# Patient Record
Sex: Female | Born: 1985 | Race: Black or African American | Hispanic: No | Marital: Married | Smoking: Never smoker
Health system: Southern US, Community
[De-identification: ages and names within clinical notes are randomized; demographics above are authoritative.]

## PROBLEM LIST (undated history)

## (undated) DIAGNOSIS — G43909 Migraine, unspecified, not intractable, without status migrainosus: Secondary | ICD-10-CM

## (undated) DIAGNOSIS — E0590 Thyrotoxicosis, unspecified without thyrotoxic crisis or storm: Secondary | ICD-10-CM

## (undated) DIAGNOSIS — K30 Functional dyspepsia: Secondary | ICD-10-CM

## (undated) DIAGNOSIS — E049 Nontoxic goiter, unspecified: Secondary | ICD-10-CM

## (undated) DIAGNOSIS — R569 Unspecified convulsions: Secondary | ICD-10-CM

## (undated) DIAGNOSIS — R002 Palpitations: Secondary | ICD-10-CM

---

## 2008-05-12 IMAGING — CT CT HEAD W/O CM
1 series · 16 of 30 positions shown, 20 images · IV contrast (agent unspecified)
Comparison: None.

CLINICAL DATA: Migraine headaches and anxiety attack.
 HEAD CT WITHOUT CONTRAST:
TECHNIQUE: Contiguous axial images were obtained from the base of the skull through the vertex according to standard protocol without contrast.

[Series 2: head routine 4.8 h47s · axial · 0.44mm/px · z∈[-113,+39]mm · 16 of 36 slices shown, 20 images]
[im 2/36  brain]
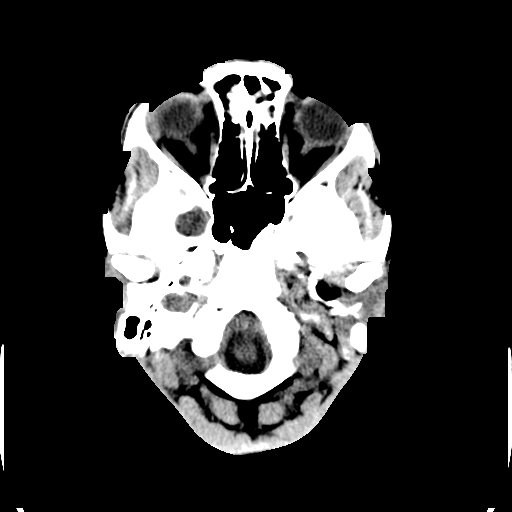
[im 2/36  bone]
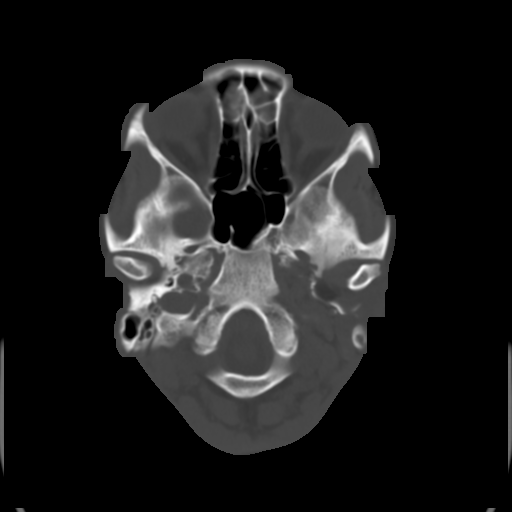
[im 4/36  brain]
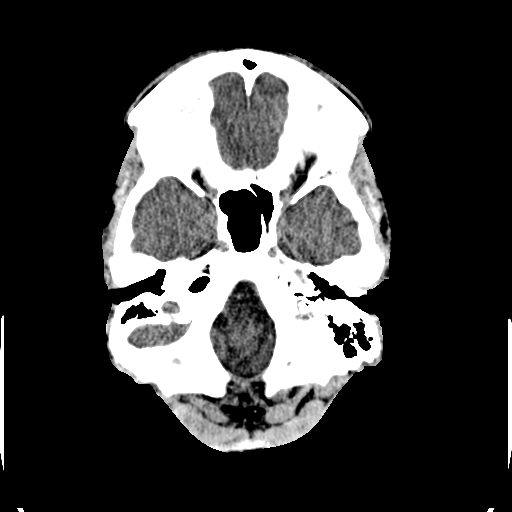
[im 7/36  brain]
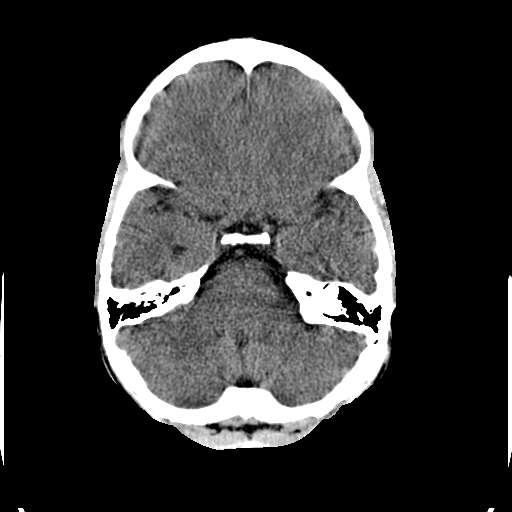
[im 9/36  brain]
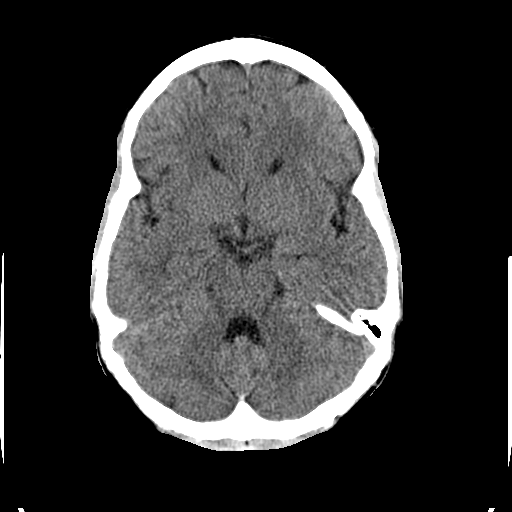
[im 10/36  brain]
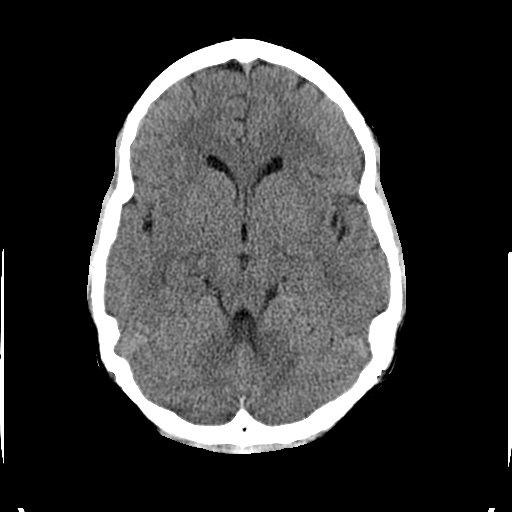
[im 10/36  bone]
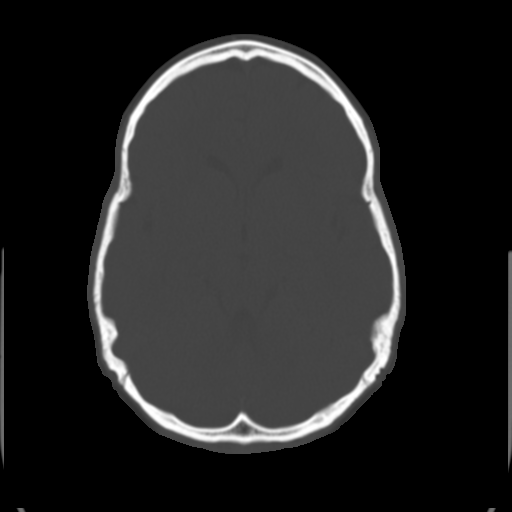
[im 13/36  brain]
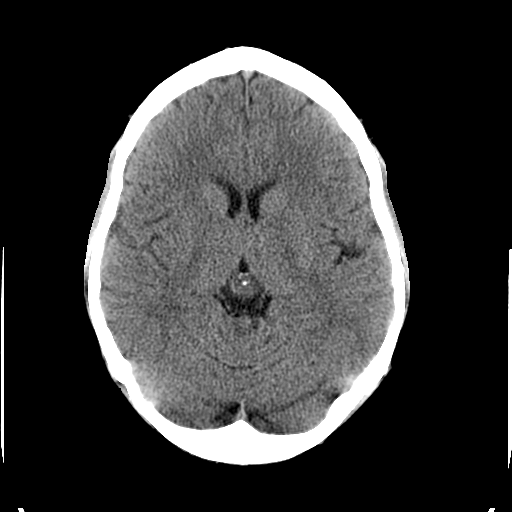
[im 15/36  brain]
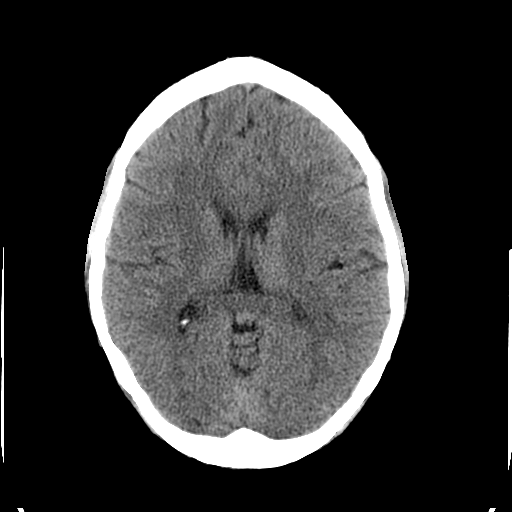
[im 17/36  brain]
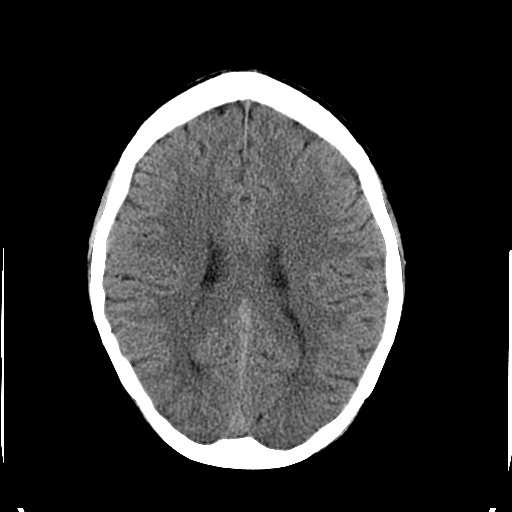
[im 19/36  brain]
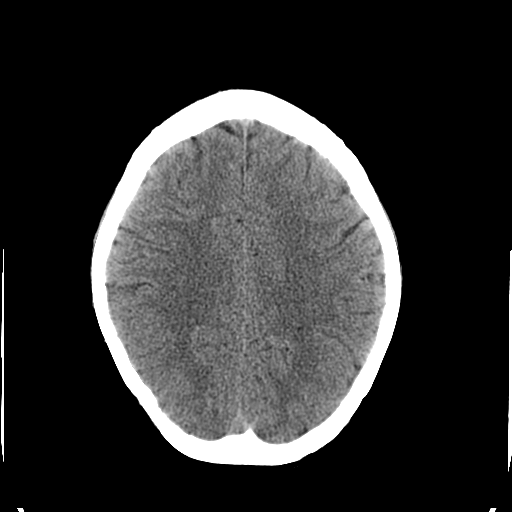
[im 19/36  bone]
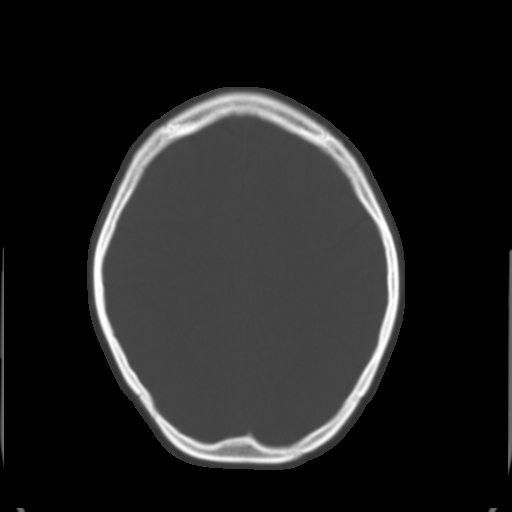
[im 21/36  brain]
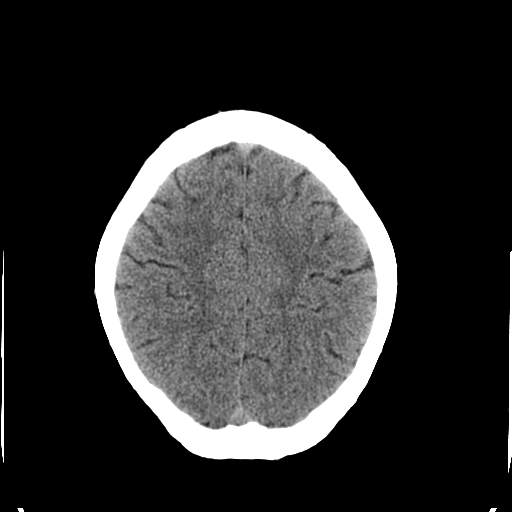
[im 23/36  brain]
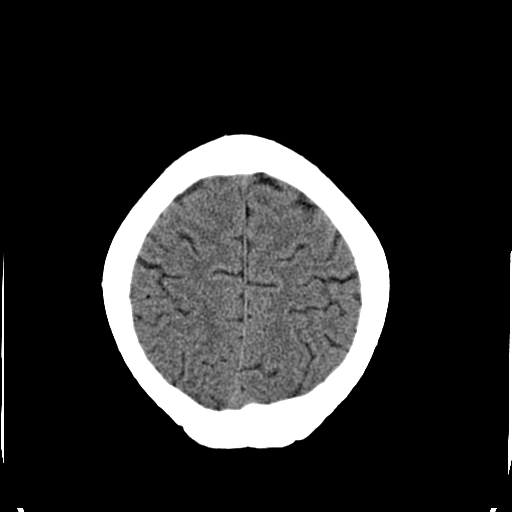
[im 26/36  brain]
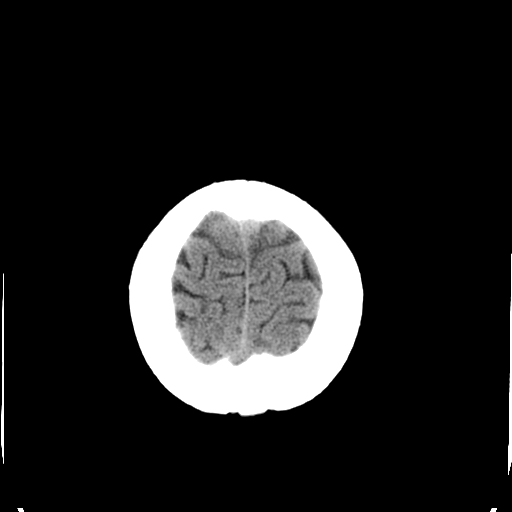
[im 27/36  brain]
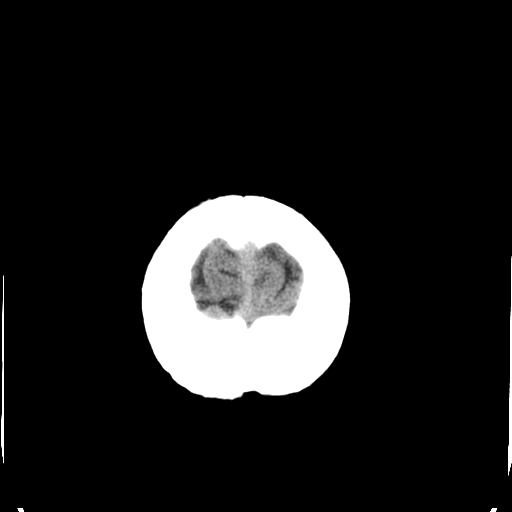
[im 27/36  bone]
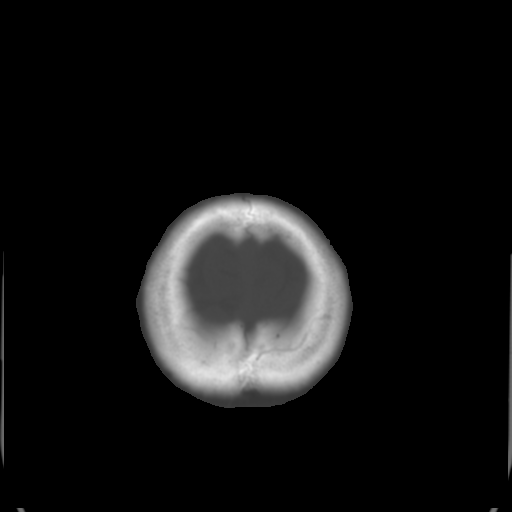
[im 29/36  brain]
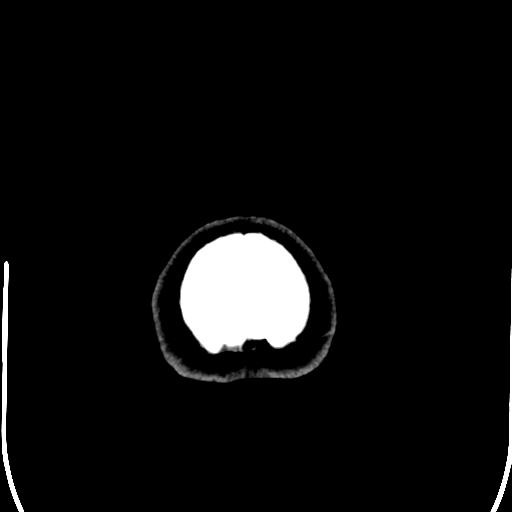
[im 32/36  brain]
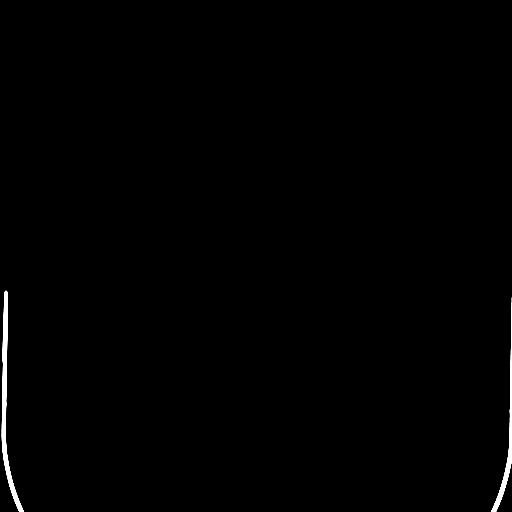
[im 34/36  brain]
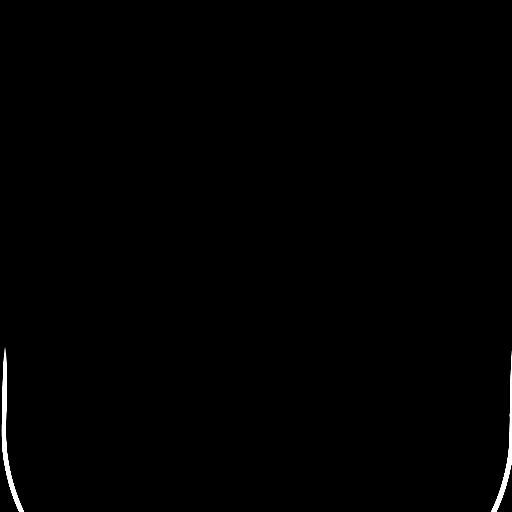

[16 of 30 positions shown; findings below may reference images not displayed]

FINDINGS: There is no evidence of intracranial hemorrhage, brain edema, acute infarct, mass lesion, or mass effect.  No other intra-axial abnormalities 
 are seen, and the ventricles are within normal limits.  No abnormal extra-axial fluid collections or masses are identified.  No skull abnormalities are noted.
IMPRESSION: Negative non-contrast head CT.

## 2008-10-10 DIAGNOSIS — R569 Unspecified convulsions: Secondary | ICD-10-CM

## 2008-10-10 HISTORY — DX: Unspecified convulsions: R56.9

## 2017-02-07 ENCOUNTER — Ambulatory Visit: Payer: Self-pay | Admitting: Otolaryngology

## 2017-02-16 ENCOUNTER — Encounter (HOSPITAL_COMMUNITY)
Admission: RE | Admit: 2017-02-16 | Discharge: 2017-02-16 | Disposition: A | Discharge status: Home / Self Care | Payer: BLUE CROSS/BLUE SHIELD | Source: Ambulatory Visit | Attending: Otolaryngology | Admitting: Otolaryngology

## 2017-02-16 ENCOUNTER — Encounter (HOSPITAL_COMMUNITY): Payer: Self-pay

## 2017-02-16 ENCOUNTER — Ambulatory Visit (HOSPITAL_COMMUNITY)
Admission: RE | Admit: 2017-02-16 | Discharge: 2017-02-16 | Disposition: A | Discharge status: Home / Self Care | Payer: BLUE CROSS/BLUE SHIELD | Source: Ambulatory Visit | Attending: Otolaryngology | Admitting: Otolaryngology

## 2017-02-16 DIAGNOSIS — Z01812 Encounter for preprocedural laboratory examination: Secondary | ICD-10-CM | POA: Diagnosis not present

## 2017-02-16 DIAGNOSIS — Z01818 Encounter for other preprocedural examination: Secondary | ICD-10-CM | POA: Diagnosis present

## 2017-02-16 DIAGNOSIS — E041 Nontoxic single thyroid nodule: Secondary | ICD-10-CM | POA: Insufficient documentation

## 2017-02-16 DIAGNOSIS — Z01810 Encounter for preprocedural cardiovascular examination: Secondary | ICD-10-CM | POA: Insufficient documentation

## 2017-02-16 HISTORY — DX: Functional dyspepsia: K30

## 2017-02-16 HISTORY — DX: Thyrotoxicosis, unspecified without thyrotoxic crisis or storm: E05.90

## 2017-02-16 HISTORY — DX: Unspecified convulsions: R56.9

## 2017-02-16 LAB — CBC
HEMATOCRIT: 37.3 % (ref 36.0–46.0)
Hemoglobin: 12.5 g/dL (ref 12.0–15.0)
MCH: 26.7 pg (ref 26.0–34.0)
MCHC: 33.5 g/dL (ref 30.0–36.0)
MCV: 79.5 fL (ref 78.0–100.0)
PLATELETS: 363 10*3/uL (ref 150–400)
RBC: 4.69 MIL/uL (ref 3.87–5.11)
RDW: 13.2 % (ref 11.5–15.5)
WBC: 5.7 10*3/uL (ref 4.0–10.5)

## 2017-02-16 LAB — BASIC METABOLIC PANEL
Anion gap: 7 (ref 5–15)
BUN: 6 mg/dL (ref 6–20)
CHLORIDE: 106 mmol/L (ref 101–111)
CO2: 24 mmol/L (ref 22–32)
CREATININE: 0.49 mg/dL (ref 0.44–1.00)
Calcium: 8.7 mg/dL — ABNORMAL LOW (ref 8.9–10.3)
GFR calc Af Amer: >60 mL/min (ref >60)
GFR calc non Af Amer: >60 mL/min (ref >60)
Glucose, Bld: 88 mg/dL (ref 65–99)
POTASSIUM: 3.7 mmol/L (ref 3.5–5.1)
Sodium: 137 mmol/L (ref 135–145)

## 2017-02-16 LAB — HCG, SERUM, QUALITATIVE: Preg, Serum: NEGATIVE

## 2017-02-16 NOTE — Pre-Procedure Instructions (Addendum)
Whitney FisherKimberly P Padilla  02/16/2017      [id number] - [place], [place] - [address] [place] [id number] Phone: [phone] Fax: [phone]    Your procedure is scheduled on 02/24/2017- Friday.  [facility] at 7:00 A.M.  Call this number if you have problems the morning of surgery:  [phone]   Remember:  Do not eat food or drink liquids after midnight.  On Thursday    Take these medicines the morning of surgery with A SIP OF WATER : Metoprolol, Birth Control Pill   Do not wear jewelry, make-up or nail polish.   Do not wear lotions, powders, or perfumes, or deoderant.   Do not shave 48 hours prior to surgery.  Men may shave face and neck.   Do not bring valuables to the hospital.   [facility] is not responsible for any belongings or valuables.  Contacts, dentures or bridgework may not be worn into surgery.  Leave your suitcase in the car.  After surgery it may be brought to your room.  For patients admitted to the hospital, discharge time will be determined by your treatment team.  Patients discharged the day of surgery will not be allowed to drive home.   Name and phone number of your driver:  Family  Special instructions:  Special Instructions: [place] - Preparing for Surgery  Before surgery, you can play an important role.  Because skin is not sterile, your skin needs to be as free of germs as possible.  You can reduce the number of germs on you skin by washing with CHG (chlorahexidine gluconate) soap before surgery.  CHG is an antiseptic cleaner which kills germs and bonds with the skin to continue killing germs even after washing.  Please DO NOT use if you have an allergy to CHG or antibacterial soaps.  If your skin becomes reddened/irritated stop using the CHG and inform your nurse when you arrive at Short Stay.  Do not shave (including legs and underarms) for at least 48  hours prior to the first CHG shower.  You may shave your face.  Please follow these instructions carefully:   1.  Shower with CHG Soap the night before surgery and the  morning of Surgery.  2.  If you choose to wash your hair, wash your hair first as usual with your  normal shampoo.  3.  After you shampoo, rinse your hair and body thoroughly to remove the  Shampoo.  4.  Use CHG as you would any other liquid soap.  You can apply chg directly to the skin and wash gently with scrungie or a clean washcloth.  5.  Apply the CHG Soap to your body ONLY FROM THE NECK DOWN.    Do not use on open wounds or open sores.  Avoid contact with your eyes, ears, mouth and genitals (private parts).  Wash genitals (private parts)   with your normal soap.  6.  Wash thoroughly, paying special attention to the area where your surgery will be performed.  7.  Thoroughly rinse your body with warm water from the neck down.  8.  DO NOT shower/wash with your normal soap after using and rinsing off   the CHG Soap.  9.  Pat yourself dry with a clean towel.            10.  Wear clean pajamas.  11.  Place clean sheets on your bed the night of your first shower and do not sleep with pets.  Day of Surgery  Do not apply any lotions/deodorants the morning of surgery.  Please wear clean clothes to the hospital/surgery center.  Please read over the following fact sheets that you were given. Pain Booklet, Coughing and Deep Breathing and Surgical Site Infection Prevention

## 2017-02-16 NOTE — Progress Notes (Signed)
Pt. Seen in ED at [facility] for palpitations.  She reports that she had a chest xray & ekg among other labs, it was discovered that she had a thyroid problem.  Pt. f/u with PCP- D. Tapper & was started on metoprolol for palpitations . Via fax , requesting CXR ( pt. Thinks it was one V. ) & EKG from ED visit.

## 2017-02-16 NOTE — H&P (Signed)
  She returns today for follow-up. After I saw her a few years ago, she was hesitant to undergo any treatment. She had a baby almost 2 years ago. Baby is doing well. She continues to have difficulty with tachycardia. She is currently on beta-blocker. She is not interested in radioactive iodine treatment.  On exam, her voice is clear and healthy. She looks otherwise very healthy. Palpation of the neck reveals diffuse enlargement of the right thyroid lobe, no other masses palpable. Lungs are clear. Heart regular rate and rhythm.    Goiter with hyperthyroidism. Recommend consider total thyroidectomy. Should be much easier to regulate her thyroid level after surgery. We did discuss the other option would be radio active iodine but she does not want to do that.  Risks of thyroid surgery were discussed in detail including recurrent nerve injury, hypocalcemia, tracheostomy, bleeding, infections, anesthetic complications. We will need to be careful about avoiding thyroid storm. She is interested in scheduling.

## 2017-02-21 NOTE — Progress Notes (Signed)
[facility]

## 2017-02-21 NOTE — Progress Notes (Signed)
Re-requested progress notes,EKG,and CXR from [facility]

## 2017-02-24 ENCOUNTER — Ambulatory Visit (HOSPITAL_COMMUNITY): Payer: BLUE CROSS/BLUE SHIELD | Admitting: Anesthesiology

## 2017-02-24 ENCOUNTER — Observation Stay (HOSPITAL_COMMUNITY)
Admission: RE | Admit: 2017-02-24 | Discharge: 2017-02-25 | Disposition: A | Discharge status: Home / Self Care | Payer: BLUE CROSS/BLUE SHIELD | Source: Ambulatory Visit | Attending: Otolaryngology | Admitting: Otolaryngology

## 2017-02-24 ENCOUNTER — Encounter (HOSPITAL_COMMUNITY)
Admission: RE | Disposition: A | Discharge status: Home / Self Care | Payer: Self-pay | Source: Ambulatory Visit | Attending: Otolaryngology

## 2017-02-24 ENCOUNTER — Encounter (HOSPITAL_COMMUNITY): Payer: Self-pay | Admitting: Urology

## 2017-02-24 DIAGNOSIS — Z79899 Other long term (current) drug therapy: Secondary | ICD-10-CM | POA: Diagnosis not present

## 2017-02-24 DIAGNOSIS — E0520 Thyrotoxicosis with toxic multinodular goiter without thyrotoxic crisis or storm: Secondary | ICD-10-CM | POA: Diagnosis not present

## 2017-02-24 DIAGNOSIS — E0500 Thyrotoxicosis with diffuse goiter without thyrotoxic crisis or storm: Secondary | ICD-10-CM | POA: Diagnosis present

## 2017-02-24 HISTORY — DX: Nontoxic goiter, unspecified: E04.9

## 2017-02-24 HISTORY — PX: TOTAL THYROIDECTOMY: SHX2547

## 2017-02-24 HISTORY — DX: Migraine, unspecified, not intractable, without status migrainosus: G43.909

## 2017-02-24 HISTORY — DX: Palpitations: R00.2

## 2017-02-24 HISTORY — PX: THYROIDECTOMY: SHX17

## 2017-02-24 LAB — CALCIUM
CALCIUM: 8.4 mg/dL — AB (ref 8.9–10.3)
Calcium: 8.9 mg/dL (ref 8.9–10.3)

## 2017-02-24 SURGERY — THYROIDECTOMY
Anesthesia: General | Site: Neck

## 2017-02-24 MED ORDER — SUCCINYLCHOLINE CHLORIDE 200 MG/10ML IV SOSY
PREFILLED_SYRINGE | INTRAVENOUS | Status: AC
  Filled 2017-02-24: qty 20

## 2017-02-24 MED ORDER — LIDOCAINE-EPINEPHRINE 1 %-1:100000 IJ SOLN
INTRAMUSCULAR | Status: AC
  Filled 2017-02-24: qty 1

## 2017-02-24 MED ORDER — FENTANYL CITRATE (PF) 250 MCG/5ML IJ SOLN
INTRAMUSCULAR | Status: AC
  Filled 2017-02-24: qty 5

## 2017-02-24 MED ORDER — PHENYLEPHRINE HCL 10 MG/ML IJ SOLN
INTRAMUSCULAR | Status: DC | PRN
  Administered 2017-02-24: 80 ug via INTRAVENOUS

## 2017-02-24 MED ORDER — ROCURONIUM BROMIDE 100 MG/10ML IV SOLN
INTRAVENOUS | Status: DC | PRN
  Administered 2017-02-24: 50 mg via INTRAVENOUS

## 2017-02-24 MED ORDER — DEXAMETHASONE SODIUM PHOSPHATE 10 MG/ML IJ SOLN
INTRAMUSCULAR | Status: DC | PRN
  Administered 2017-02-24: 10 mg via INTRAVENOUS

## 2017-02-24 MED ORDER — HYDROMORPHONE HCL 1 MG/ML IJ SOLN
INTRAMUSCULAR | Status: AC
  Filled 2017-02-24: qty 0.5

## 2017-02-24 MED ORDER — PROPOFOL 10 MG/ML IV BOLUS
INTRAVENOUS | Status: AC
  Filled 2017-02-24: qty 20

## 2017-02-24 MED ORDER — 0.9 % SODIUM CHLORIDE (POUR BTL) OPTIME
TOPICAL | Status: DC | PRN
  Administered 2017-02-24: 1000 mL

## 2017-02-24 MED ORDER — HYDROCODONE-ACETAMINOPHEN 5-325 MG PO TABS
1.0000 | ORAL_TABLET | ORAL | Status: DC | PRN
  Administered 2017-02-24 – 2017-02-25 (×3): 2 via ORAL
  Filled 2017-02-24 (×3): qty 2

## 2017-02-24 MED ORDER — LIDOCAINE HCL 4 % EX SOLN
CUTANEOUS | Status: DC | PRN
  Administered 2017-02-24: 4 mL via TOPICAL

## 2017-02-24 MED ORDER — NORETHINDRONE-ETH ESTRADIOL 0.5-35 MG-MCG PO TABS
1.0000 | ORAL_TABLET | Freq: Every day | ORAL | Status: DC
  Administered 2017-02-25: 1 via ORAL

## 2017-02-24 MED ORDER — LIDOCAINE-EPINEPHRINE 1 %-1:100000 IJ SOLN
INTRAMUSCULAR | Status: DC | PRN
  Administered 2017-02-24: 3 mL

## 2017-02-24 MED ORDER — FENTANYL CITRATE (PF) 100 MCG/2ML IJ SOLN
INTRAMUSCULAR | Status: DC | PRN
  Administered 2017-02-24 (×2): 50 ug via INTRAVENOUS
  Administered 2017-02-24: 100 ug via INTRAVENOUS

## 2017-02-24 MED ORDER — PROMETHAZINE HCL 25 MG PO TABS: 25.0000 mg | ORAL_TABLET | Freq: Four times a day (QID) | ORAL | Status: DC | PRN

## 2017-02-24 MED ORDER — METOPROLOL SUCCINATE ER 50 MG PO TB24
50.0000 mg | ORAL_TABLET | Freq: Every day | ORAL | Status: DC
  Administered 2017-02-25: 50 mg via ORAL
  Filled 2017-02-24: qty 1

## 2017-02-24 MED ORDER — HYDROCODONE-ACETAMINOPHEN 7.5-325 MG PO TABS: 1.0000 | ORAL_TABLET | Freq: Four times a day (QID) | ORAL | 0 refills | Status: DC | PRN

## 2017-02-24 MED ORDER — GLYCOPYRROLATE 0.2 MG/ML IJ SOLN
INTRAMUSCULAR | Status: DC | PRN
  Administered 2017-02-24: 0.4 mg via INTRAVENOUS

## 2017-02-24 MED ORDER — IBUPROFEN 100 MG/5ML PO SUSP
400.0000 mg | Freq: Four times a day (QID) | ORAL | Status: DC | PRN
  Filled 2017-02-24: qty 20

## 2017-02-24 MED ORDER — HYDROMORPHONE HCL 1 MG/ML IJ SOLN
0.2500 mg | INTRAMUSCULAR | Status: DC | PRN
  Administered 2017-02-24 (×2): 0.5 mg via INTRAVENOUS

## 2017-02-24 MED ORDER — MIDAZOLAM HCL 2 MG/2ML IJ SOLN
INTRAMUSCULAR | Status: AC
  Filled 2017-02-24: qty 2

## 2017-02-24 MED ORDER — ARTIFICIAL TEARS OPHTHALMIC OINT
TOPICAL_OINTMENT | OPHTHALMIC | Status: DC | PRN
  Administered 2017-02-24: 1 via OPHTHALMIC

## 2017-02-24 MED ORDER — LIDOCAINE 2% (20 MG/ML) 5 ML SYRINGE
INTRAMUSCULAR | Status: AC
  Filled 2017-02-24: qty 10

## 2017-02-24 MED ORDER — PROPOFOL 10 MG/ML IV BOLUS
INTRAVENOUS | Status: DC | PRN
  Administered 2017-02-24: 120 mg via INTRAVENOUS

## 2017-02-24 MED ORDER — NEOSTIGMINE METHYLSULFATE 10 MG/10ML IV SOLN
INTRAVENOUS | Status: DC | PRN
  Administered 2017-02-24: 3 mg via INTRAVENOUS

## 2017-02-24 MED ORDER — NEOSTIGMINE METHYLSULFATE 5 MG/5ML IV SOSY
PREFILLED_SYRINGE | INTRAVENOUS | Status: AC
  Filled 2017-02-24: qty 5

## 2017-02-24 MED ORDER — LACTATED RINGERS IV SOLN
INTRAVENOUS | Status: DC
  Administered 2017-02-24 (×2): via INTRAVENOUS

## 2017-02-24 MED ORDER — MIDAZOLAM HCL 5 MG/5ML IJ SOLN
INTRAMUSCULAR | Status: DC | PRN
  Administered 2017-02-24: 2 mg via INTRAVENOUS

## 2017-02-24 MED ORDER — DEXTROSE-NACL 5-0.9 % IV SOLN
INTRAVENOUS | Status: DC
  Administered 2017-02-24: 14:00:00 via INTRAVENOUS

## 2017-02-24 MED ORDER — DEXAMETHASONE SODIUM PHOSPHATE 10 MG/ML IJ SOLN
INTRAMUSCULAR | Status: AC
  Filled 2017-02-24: qty 2

## 2017-02-24 MED ORDER — PROMETHAZINE HCL 25 MG RE SUPP: 25.0000 mg | Freq: Four times a day (QID) | RECTAL | 1 refills | Status: DC | PRN

## 2017-02-24 MED ORDER — ONDANSETRON HCL 4 MG/2ML IJ SOLN
INTRAMUSCULAR | Status: AC
  Filled 2017-02-24: qty 4

## 2017-02-24 MED ORDER — LIDOCAINE HCL (CARDIAC) 20 MG/ML IV SOLN
INTRAVENOUS | Status: DC | PRN
  Administered 2017-02-24: 60 mg via INTRAVENOUS

## 2017-02-24 MED ORDER — ONDANSETRON HCL 4 MG/2ML IJ SOLN
INTRAMUSCULAR | Status: DC | PRN
  Administered 2017-02-24: 4 mg via INTRAVENOUS

## 2017-02-24 MED ORDER — PROMETHAZINE HCL 25 MG RE SUPP: 25.0000 mg | Freq: Four times a day (QID) | RECTAL | Status: DC | PRN

## 2017-02-24 MED ORDER — LEVOTHYROXINE SODIUM 100 MCG PO TABS: 100.0000 ug | ORAL_TABLET | Freq: Every day | ORAL | 6 refills | Status: DC

## 2017-02-24 MED ORDER — NORETHINDRONE-ETH ESTRADIOL 0.5-35 MG-MCG PO TABS: 1.0000 | ORAL_TABLET | Freq: Every day | ORAL | Status: DC

## 2017-02-24 SURGICAL SUPPLY — 32 items
CANISTER SUCT 3000ML PPV (MISCELLANEOUS) ×3 IMPLANT
CLEANER TIP ELECTROSURG 2X2 (MISCELLANEOUS) ×3 IMPLANT
CONT SPEC 4OZ CLIKSEAL STRL BL (MISCELLANEOUS) ×3 IMPLANT
CORDS BIPOLAR (ELECTRODE) ×3 IMPLANT
COVER SURGICAL LIGHT HANDLE (MISCELLANEOUS) ×3 IMPLANT
DERMABOND ADVANCED (GAUZE/BANDAGES/DRESSINGS) ×2
DERMABOND ADVANCED .7 DNX12 (GAUZE/BANDAGES/DRESSINGS) ×1 IMPLANT
DRAIN HEMOVAC 7FR (DRAIN) ×3 IMPLANT
ELECT COATED BLADE 2.86 ST (ELECTRODE) ×3 IMPLANT
ELECT REM PT RETURN 9FT ADLT (ELECTROSURGICAL) ×3
ELECTRODE REM PT RTRN 9FT ADLT (ELECTROSURGICAL) ×1 IMPLANT
EVACUATOR SILICONE 100CC (DRAIN) ×3 IMPLANT
FORCEPS BIPOLAR SPETZLER 8 1.0 (NEUROSURGERY SUPPLIES) ×3 IMPLANT
GAUZE SPONGE 4X4 16PLY XRAY LF (GAUZE/BANDAGES/DRESSINGS) ×3 IMPLANT
GLOVE ECLIPSE 7.5 STRL STRAW (GLOVE) ×3 IMPLANT
GOWN STRL REUS W/ TWL LRG LVL3 (GOWN DISPOSABLE) ×3 IMPLANT
GOWN STRL REUS W/TWL LRG LVL3 (GOWN DISPOSABLE) ×6
KIT BASIN OR (CUSTOM PROCEDURE TRAY) ×3 IMPLANT
KIT ROOM TURNOVER OR (KITS) ×3 IMPLANT
NEEDLE PRECISIONGLIDE 27X1.5 (NEEDLE) ×3 IMPLANT
NS IRRIG 1000ML POUR BTL (IV SOLUTION) ×3 IMPLANT
PAD ARMBOARD 7.5X6 YLW CONV (MISCELLANEOUS) ×6 IMPLANT
PENCIL FOOT CONTROL (ELECTRODE) ×3 IMPLANT
SHEARS HARMONIC 9CM CVD (BLADE) ×3 IMPLANT
STAPLER VISISTAT 35W (STAPLE) ×3 IMPLANT
SUT CHROMIC 3 0 PS 2 (SUTURE) ×3 IMPLANT
SUT CHROMIC 4 0 PS 2 18 (SUTURE) ×3 IMPLANT
SUT ETHILON 3 0 PS 1 (SUTURE) ×3 IMPLANT
SUT SILK 2 0 SH (SUTURE) ×3 IMPLANT
SUT SILK 4 0 REEL (SUTURE) ×3 IMPLANT
TOWEL OR 17X24 6PK STRL BLUE (TOWEL DISPOSABLE) ×3 IMPLANT
TRAY ENT MC OR (CUSTOM PROCEDURE TRAY) ×3 IMPLANT

## 2017-02-24 NOTE — Discharge Instructions (Signed)
You may shower and use soap and water. Do not use any creams, oils or ointment. ° °

## 2017-02-24 NOTE — Op Note (Addendum)
OPERATIVE REPORT  DATE OF SURGERY: 02/24/2017  PATIENT:  Alver FisherKimberly P Wigle,  31 y.o. female  PRE-OPERATIVE DIAGNOSIS:  THYROID GOITER  POST-OPERATIVE DIAGNOSIS:  THYROID GOITER  PROCEDURE:  Procedure(s): TOTAL THYROIDECTOMY  SURGEON:  Susy FrizzleJefry H Kashon Kraynak, MD  ASSISTANTS: Mar DaringLinda Thompson, RNFA  ANESTHESIA:   General   EBL:  20 ml  DRAINS: 7 French round J-P  LOCAL MEDICATIONS USED:  1% Xylocaine with epinephrine  SPECIMEN:  Total thyroidectomy, suture marks right lobe  COUNTS:  Correct  PROCEDURE DETAILS: The patient was taken to the operating room and placed on the operating table in the supine position. A shoulder roll was placed beneath the shoulder blades and the neck was extended. The neck was prepped and draped in a standard fashion. A low collar transverse incision was outlined with a marking pen and was incised with a #15 scalpel after local anesthetic was infiltrated . Dissection was continued down through the platysma layer.  Self-retaining retractors were used throughout the case.  The midline fascia was divided. The strap muscles were reflected laterally off of the right lobe which was severely enlarged. The left lobe was very small. The right lobe was bluntly dissected free of surrounding tissue. The superior vasculature were separately identified ligated between clamps and divided. 4-0 silk ties were used as well as the harmonic dissector depending on the size of the vessel. The dissection was right up against the capsule of the gland. The recurrent nerve was not identified on either side. A putative parathyroid gland was identified on the right and preserved with its blood supply. This was in the region of the superior parathyroid. The inferior vasculature was ligated and divided. The gland was dissected off of the trachea. The isthmus was kept intact. Identical dissection was accomplished on the left side. The entire specimen was removed and sent for pathologic evaluation. The  wound was irrigated. Bipolar cautery and additional 4-0 ties were used as needed for complete hemostasis. The gland was exited through a separate stab incision inferiorly. The drain was connected and charged. The midline fascia was reapproximated with interrupted 3-0 chromic. The platysma layer was reapproximated as well and a similar fashion. A running 4-0 subcuticular chromic closure was accomplished. Dermabond was used on the skin. The patient was awakened extubated and transferred to recovery in stable condition.  The drain was secured in place with a nylon suture. The midline fascia was reapproximated with interrupted chromic suture. The platysma layer was also reapproximated with interrupted chromic suture. A running subcuticular closure was accomplished. Dermabond was used on the skin. The drain was charged. The patient was awakened, extubated and transferred to recovery in stable condition.   PATIENT DISPOSITION:  To PACU, stable

## 2017-02-24 NOTE — Anesthesia Postprocedure Evaluation (Signed)
Anesthesia Post Note  Patient: Alver FisherKimberly P Riebel  Procedure(s) Performed: Procedure(s) (LRB): TOTAL THYROIDECTOMY (N/A)  Patient location during evaluation: PACU Anesthesia Type: General Level of consciousness: awake and alert Pain management: pain level controlled Vital Signs Assessment: post-procedure vital signs reviewed and stable Respiratory status: spontaneous breathing, nonlabored ventilation and respiratory function stable Cardiovascular status: blood pressure returned to baseline and stable Postop Assessment: no signs of nausea or vomiting Anesthetic complications: no       Last Vitals:  Vitals:   02/24/17 1205 02/24/17 1220  BP: 123/75 123/74  Pulse: 97 100  Resp: 19 17  Temp:      Last Pain:  Vitals:   02/24/17 1220  TempSrc:   PainSc: Asleep                 Margues Filippini,W. EDMOND

## 2017-02-24 NOTE — Progress Notes (Signed)
02/24/2017 5:13 PM  Whitney Padilla, Whitney Padilla [id number]  Post-Op check   Temp:  [98.4 F (36.9 C)-98.7 F (37.1 C)] 98.4 F (36.9 C) (05/18 1324) Pulse Rate:  [92-117] 108 (05/18 1324) Resp:  [12-22] 18 (05/18 1324) BP: (109-129)/(63-77) 114/70 (05/18 1324) SpO2:  [96 %-100 %] 98 % (05/18 1324) Weight:  [55.4 kg (122 lb 3.2 oz)-55.8 kg (123 lb)] 55.4 kg (122 lb 3.2 oz) (05/18 1324),     Intake/Output Summary (Last 24 hours) at 02/24/17 1713 Last data filed at 02/24/17 1315  Gross per 24 hour  Intake             1300 ml  Output               60 ml  Net             1240 ml   JP drain 20 ml  Results for orders placed or performed during the hospital encounter of 02/24/17 (from the past 24 hour(s))  Calcium     Status: Abnormal   Collection Time: 02/24/17 11:02 AM  Result Value Ref Range   Calcium 8.4 (L) 8.9 - 10.3 mg/dL    SUBJECTIVE:  Throat/neck pain.  Breathing well.  Swallowing liquids.  Voice good.  spont void  OBJECTIVE:  Drains functional.  Neck flat.  Voice strong and clear.  Breathing well.  IMPRESSION:  Satisfactory check  PLAN:  Probable discharge in AM.    Lazarus SalinesWOLICKI, Zola ButtonKAROL

## 2017-02-24 NOTE — Transfer of Care (Signed)
Immediate Anesthesia Transfer of Care Note  Patient: Whitney FisherKimberly P Harpenau  Procedure(s) Performed: Procedure(s): TOTAL THYROIDECTOMY (N/A)  Patient Location: PACU  Anesthesia Type:General  Level of Consciousness: awake, alert , oriented and patient cooperative  Airway & Oxygen Therapy: Patient Spontanous Breathing and Patient connected to nasal cannula oxygen  Post-op Assessment: Report given to RN and Post -op Vital signs reviewed and stable  Post vital signs: Reviewed and stable  Last Vitals:  Vitals:   02/24/17 0807 02/24/17 1035  BP:    Pulse:    Resp: 15   Temp:  37.1 C    Last Pain:  Vitals:   02/24/17 1035  TempSrc:   PainSc: Asleep         Complications: No apparent anesthesia complications

## 2017-02-24 NOTE — Anesthesia Preprocedure Evaluation (Signed)
Anesthesia Evaluation  Patient identified by MRN, date of birth, ID band Patient awake    Reviewed: Allergy & Precautions, H&P , NPO status , Patient's Chart, lab work & pertinent test results  Airway Mallampati: I  TM Distance: >3 FB Neck ROM: Full    Dental no notable dental hx. (+) Teeth Intact, Dental Advisory Given   Pulmonary neg pulmonary ROS,    Pulmonary exam normal breath sounds clear to auscultation       Cardiovascular negative cardio ROS   Rhythm:Regular Rate:Normal     Neuro/Psych  Headaches, negative psych ROS   GI/Hepatic negative GI ROS, Neg liver ROS,   Endo/Other  Hyperthyroidism   Renal/GU negative Renal ROS  negative genitourinary   Musculoskeletal   Abdominal   Peds  Hematology negative hematology ROS (+)   Anesthesia Other Findings   Reproductive/Obstetrics negative OB ROS                             Anesthesia Physical Anesthesia Plan  ASA: II  Anesthesia Plan: General   Post-op Pain Management:    Induction: Intravenous  Airway Management Planned: Oral ETT  Additional Equipment:   Intra-op Plan:   Post-operative Plan: Extubation in OR  Informed Consent: I have reviewed the patients History and Physical, chart, labs and discussed the procedure including the risks, benefits and alternatives for the proposed anesthesia with the patient or authorized representative who has indicated his/her understanding and acceptance.   Dental advisory given  Plan Discussed with: CRNA  Anesthesia Plan Comments:         Anesthesia Quick Evaluation

## 2017-02-25 ENCOUNTER — Encounter (HOSPITAL_COMMUNITY): Payer: Self-pay | Admitting: Otolaryngology

## 2017-02-25 DIAGNOSIS — E0520 Thyrotoxicosis with toxic multinodular goiter without thyrotoxic crisis or storm: Secondary | ICD-10-CM | POA: Diagnosis not present

## 2017-02-25 LAB — CALCIUM
CALCIUM: 8.3 mg/dL — AB (ref 8.9–10.3)
CALCIUM: 8.6 mg/dL — AB (ref 8.9–10.3)

## 2017-02-25 NOTE — Progress Notes (Signed)
Pt ready for DC to home accompanied by husband. Prescriptions given and explained. Pt understands her follow-up appointments and when to call doctor. DC instructions reviewed and copy given to pt.

## 2017-02-25 NOTE — Progress Notes (Signed)
Cosign for the progress note for Whitney AbedLindsay T. Hembree, Student RN for 02/25/17 at 11:54 am.

## 2017-02-25 NOTE — Discharge Summary (Signed)
02/25/2017 9:57 AM  Whitney Padilla, Whitney Padilla [id number]  Post-Op Day 1    Temp:  [98.2 F (36.8 C)-98.7 F (37.1 C)] 98.4 F (36.9 C) (05/19 0833) Pulse Rate:  [90-117] 90 (05/19 0833) Resp:  [12-22] 13 (05/19 0833) BP: (105-129)/(57-77) 114/71 (05/19 0833) SpO2:  [96 %-100 %] 100 % (05/19 0833) Weight:  [55.4 kg (122 lb 3.2 oz)] 55.4 kg (122 lb 3.2 oz) (05/18 1324),     Intake/Output Summary (Last 24 hours) at 02/25/17 0957 Last data filed at 02/25/17 0700  Gross per 24 hour  Intake           1922.5 ml  Output               80 ml  Net           1842.5 ml   Drain: 5 ml  Results for orders placed or performed during the hospital encounter of 02/24/17 (from the past 24 hour(s))  Calcium     Status: Abnormal   Collection Time: 02/24/17 11:02 AM  Result Value Ref Range   Calcium 8.4 (L) 8.9 - 10.3 mg/dL  Calcium     Status: None   Collection Time: 02/24/17  6:18 PM  Result Value Ref Range   Calcium 8.9 8.9 - 10.3 mg/dL  Calcium     Status: Abnormal   Collection Time: 02/25/17  3:09 AM  Result Value Ref Range   Calcium 8.3 (L) 8.9 - 10.3 mg/dL    SUBJECTIVE:  1  OBJECTIVE:  Some throat pain.  Uvula feels enlarged  IMPRESSION:  Color and energy good.  Voice clear. Breathing well.  Sl eschar at tip of uvula c/w minor trauma.  PLAN:  Discharge to home and care of family.     Admit:  18 MAY Discharge:  19 MAY Final diagnosis:  Hyperthyroidism Proc:  Total thyroidectomy Comp: none Cond:  Ambulatory.  Pain controlled.  Breathing, swallowing speaking well.  Ca++ in range.  Drain out Recheck:  2 weeks Rx:  Synthroid, Hydrocodone, Phenergan supp Instructions written and given  Hosp Course:  Underwent total thyroidectomy.  Observed 23 h.  Pain controlled.  Breathing and speaking well.  Swallowing well.  Min drainage.  Ca++ good.  POD 1 AM drain removed and discharged to home and care of family.  Flo ShanksWOLICKI, Reed Eifert

## 2019-05-10 IMAGING — CR DG CHEST 2V
2 series · 2 of 2 positions shown · non-contrast
Comparison: None.

CLINICAL DATA: Preop total thyroidectomy.

EXAM:
CHEST  2 VIEW

[w chest pa]
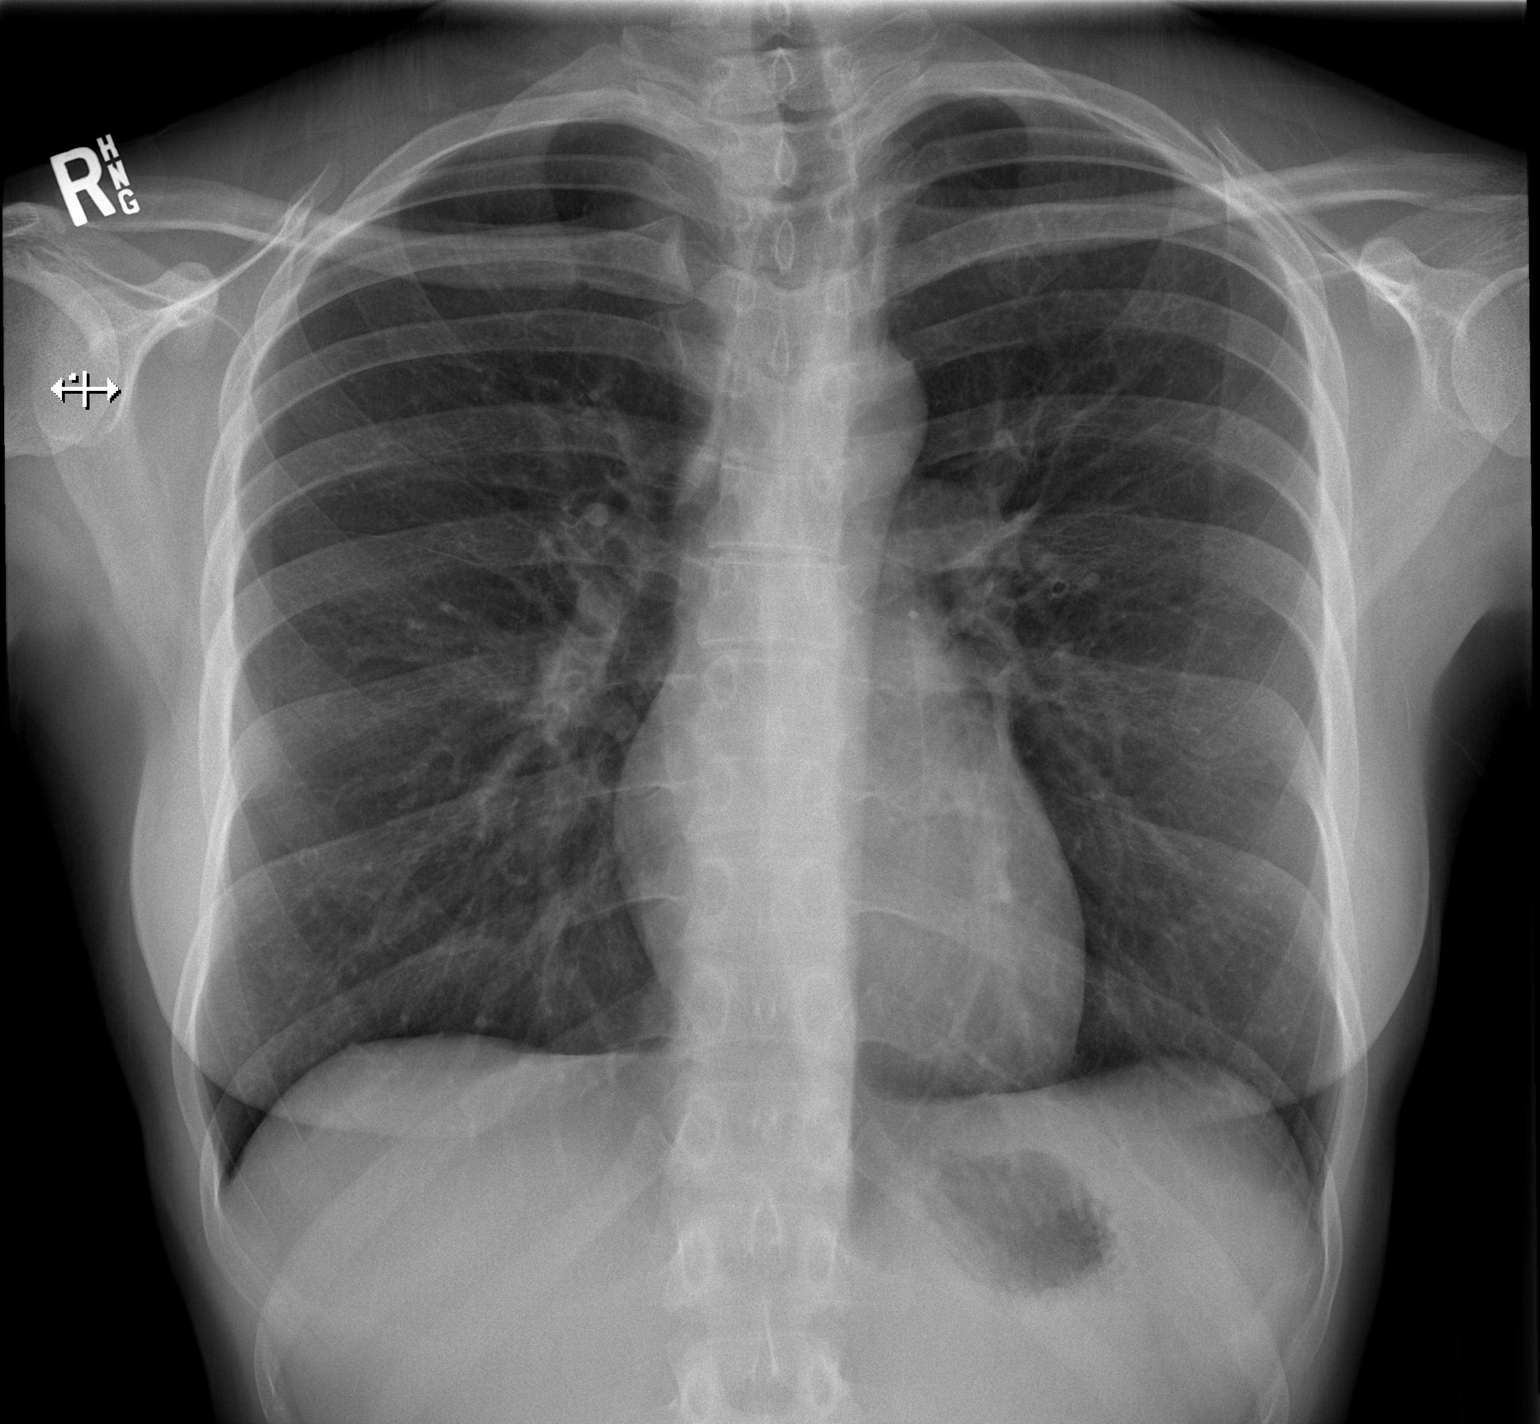

[w chest lat]
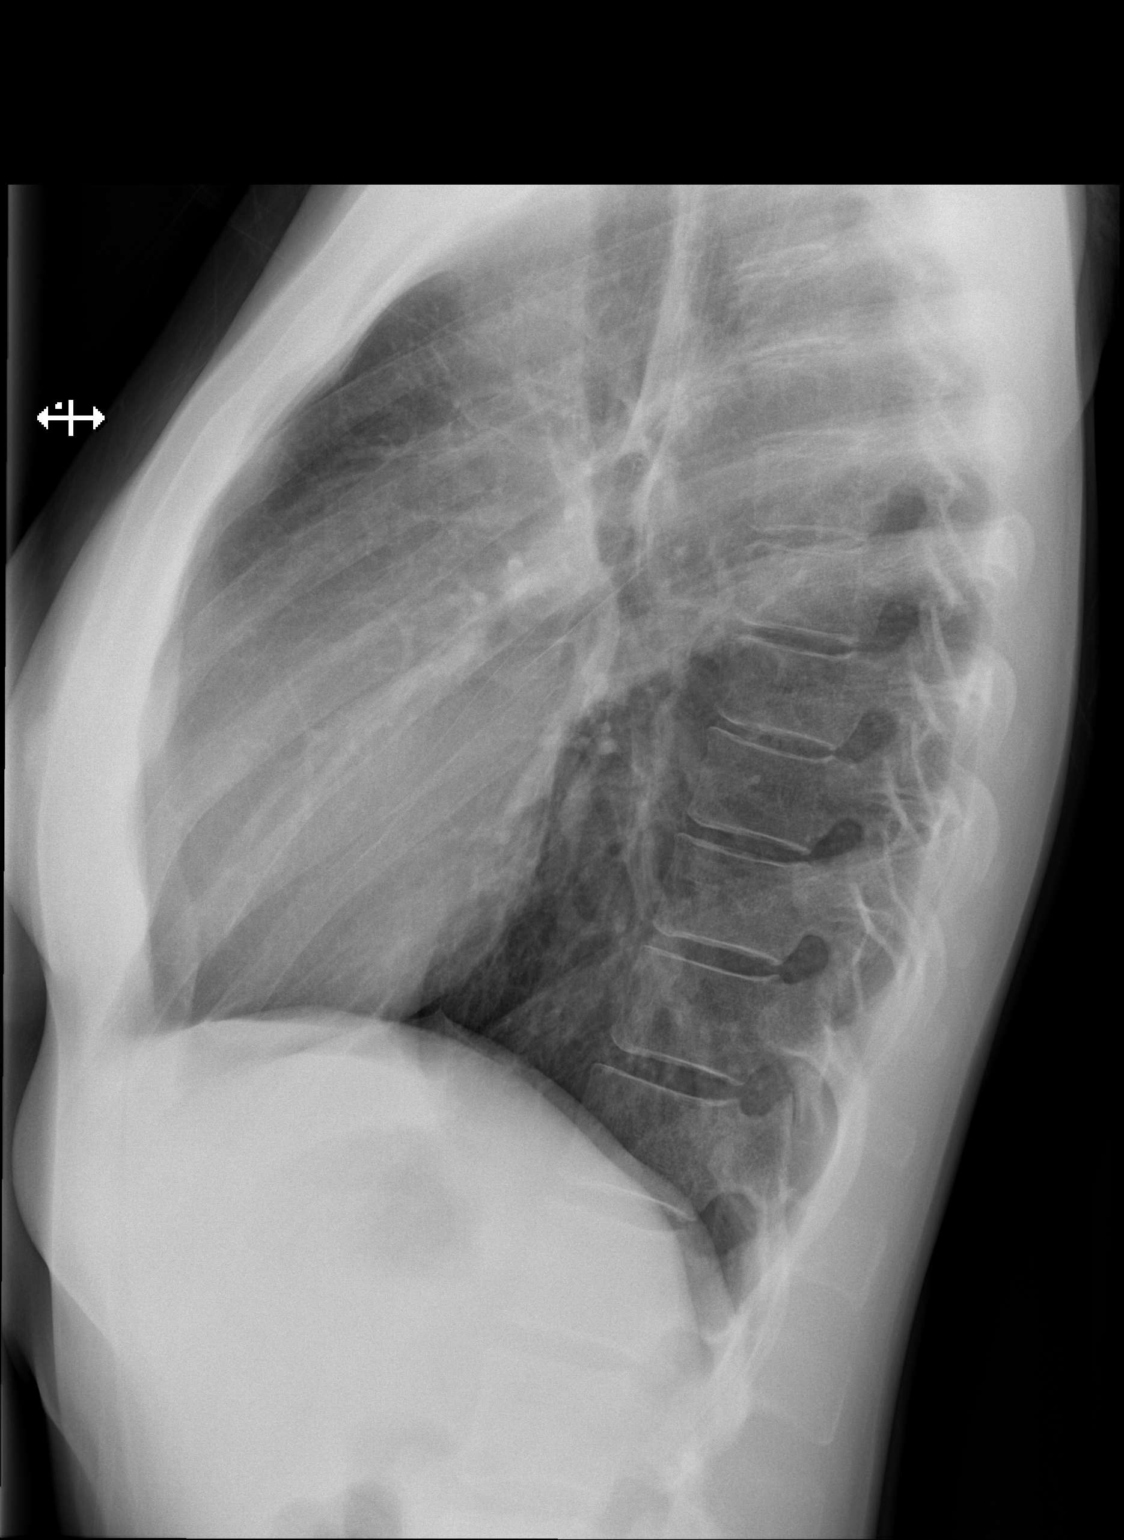

[2 of 2 positions shown; findings below may reference images not displayed]

FINDINGS: The cardiomediastinal contours are normal. The lungs are clear.
Pulmonary vasculature is normal. No consolidation, pleural effusion,
or pneumothorax. No acute osseous abnormalities are seen.
IMPRESSION: Normal radiographs of the chest.

## 2022-01-27 ENCOUNTER — Encounter: Payer: Self-pay | Admitting: Neurology

## 2022-06-14 NOTE — Progress Notes (Signed)
NEUROLOGY CONSULTATION NOTE  Whitney Padilla MRN: [id number] DOB: 1986/09/13  Referring provider: Wyvonnia Lora, MD Primary care provider: Wyvonnia Lora, MD  Reason for consult: migraines  Assessment/Plan:   Menstrual related migraine/migraine with aura Remote history of seizure x 2.  Workup initially negative.  No recurrent episodes since 2007.  Further workup/management not warranted at this time.  Migraine prevention:  start topiramate 25mg  at bedtime.  We can increase dose to 50mg  at bedtime in 4 weeks if needed.  Side effects, including teratogenic symptoms, discussed Migraine rescue:  she will try rizatriptan 100mg  Limit use of pain relievers to no more than 2 days out of week to prevent risk of rebound or medication-overuse headache. As she has migraine with aura, advised to discontinue birth control medication. Keep headache diary Follow up 4 months    Subjective:  Whitney Padilla is a 36 year old female with history of hyperthyroidism/goiter s/p thyroidectomy and remote history of isolated seizure who presents for migraines.  History supplemented by referring provider's note.  Onset:  Since high school following head injury when she fell off of swing.  Improved.  Migraines worse since around 2021. Location:  band-like Quality:  throbbing Intensity:  Severe Aura:  blurred vision (unilateral then becomes bilateral) for 10-15 minutes an hour before headache Prodrome:  absent Associated symptoms:  Nausea, photophobia, phonophobia.  Rarely with vomiting.  She denies associated unilateral numbness or weakness. Duration:  Usually last 2 days.  May last 4-5 days during menses (total 8-10 headache days a month) Frequency:  usually 3-4 times a month.   Triggers:  menses, smells (perfumes, gasoline), lights (street lights, ceiling lights) Relieving factors:  vomiting Activity:  aggravates  She had one or two seizures in 2005-2007.  Unclear if related to the prior head injury.   Passed out at work and woke up in hospital.  At the time, the hospital said it wasn't a seizure.  Followed up with PCP who admitted her for seizure workup.  Workup was negative (MRI, EEG).  May have had a second seizure during this time period, lost consciousness very briefly.  Noted to have convulsions.  Associated with migraine.  No subsequent episodes.    Past NSAIDS/analgesics:  ibuprofen (GI upset) Past abortive triptans:  sumatriptan tab Past abortive ergotamine:  none Past muscle relaxants:  none Past anti-emetic:  none Past antihypertensive medications:  none Past antidepressant medications:  none Past anticonvulsant medications:  none Past anti-CGRP:  none Past vitamins/Herbal/Supplements:  none Past antihistamines/decongestants:  none Other past therapies:  none  Current NSAIDS/analgesics:  acetaminophen, Excedrin (rare) Current triptans:  none Current ergotamine:  none Current anti-emetic:  promethazine 25mg  PR Current muscle relaxants:  none Current Antihypertensive medications:  metoprolol succinate Current Antidepressant medications:  none Current Anticonvulsant medications:  none Current anti-CGRP:  none Current Vitamins/Herbal/Supplements:  none Current Antihistamines/Decongestants:  none Other therapy:  none    Caffeine:  2-3 cups coffee a month.  At least one soda or tea daily Does not drink enough water Exercise:  not routine Depression:  no; Anxiety:  no Other pain:  no Sleep hygiene:  somewhat improved as no longer co-sleeping with child.  Wakes up 2-3 times a night (go to bathroom or to check on her children) Family history of headache:  No.   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        PAST MEDICAL HISTORY: Past Medical History:  Diagnosis  Date   Heart palpitations    Hyperthyroidism    Indigestion    has only occasionally,reports she doesn't use anything for it    Migraine    "a few/year;  once accompanied by a seizure," (02/24/2017)   Seizures (HCC) 2010   x1- says she never took medicine for it.    Thyroid goiter    /notes 02/16/2017    PAST SURGICAL HISTORY: Past Surgical History:  Procedure Laterality Date   THYROIDECTOMY N/A 02/24/2017   Procedure: TOTAL THYROIDECTOMY;  Surgeon: Serena Colonel, MD;  Location: [facility] OR;  Service: ENT;  Laterality: N/A;   TOTAL THYROIDECTOMY  02/24/2017   VAGINAL DELIVERY     /w epidural - without complications     MEDICATIONS: Current Outpatient Medications on File Prior to Visit  Medication Sig Dispense Refill   acetaminophen (TYLENOL) 500 MG tablet Take 500 mg by mouth every 6 (six) hours as needed.     HYDROcodone-acetaminophen (NORCO) 7.5-325 MG tablet Take 1 tablet by mouth every 6 (six) hours as needed for moderate pain. 20 tablet 0   levothyroxine (SYNTHROID) 100 MCG tablet Take 1 tablet (100 mcg total) by mouth daily. 30 tablet 6   metoprolol succinate (TOPROL-XL) 50 MG 24 hr tablet Take 50 mg by mouth daily before breakfast. Take with or immediately following a meal.      NORTREL 0.5/35, 28, 0.5-35 MG-MCG tablet Take 1 tablet by mouth daily before breakfast.      promethazine (PHENERGAN) 25 MG suppository Place 1 suppository (25 mg total) rectally every 6 (six) hours as needed for nausea or vomiting. 12 suppository 1   No current facility-administered medications on file prior to visit.    ALLERGIES: No Known Allergies  FAMILY HISTORY: No family history on file.  Objective:  Blood pressure 120/82, pulse 91, height 5\' 4"  (1.626 m), weight 153 lb 6.4 oz (69.6 kg), SpO2 99 %. General: No acute distress.  Patient appears well-groomed.   Head:  Normocephalic/atraumatic Eyes:  fundi examined but not visualized Neck: supple, no paraspinal tenderness, full range of motion Back: No paraspinal tenderness Heart: regular rate and rhythm Lungs: Clear to auscultation bilaterally. Vascular: No carotid bruits. Neurological  Exam: Mental status: alert and oriented to person, place, and time, speech fluent and not dysarthric, language intact. Cranial nerves: CN I: not tested CN II: pupils equal, round and reactive to light, visual fields intact CN III, IV, VI:  full range of motion, no nystagmus, no ptosis CN V: facial sensation intact. CN VII: upper and lower face symmetric CN VIII: hearing intact CN IX, X: gag intact, uvula midline CN XI: sternocleidomastoid and trapezius muscles intact CN XII: tongue midline Bulk & Tone: normal, no fasciculations. Motor:  muscle strength 5/5 throughout Sensation:  Pinprick, temperature and vibratory sensation intact. Deep Tendon Reflexes:  2+ throughout,  toes downgoing.   Finger to nose testing:  Without dysmetria.   Heel to shin:  Without dysmetria.   Gait:  Normal station and stride.  Romberg negative.    Thank you for allowing me to take part in the care of this patient.  , DO  CC: Shon Millet, MD

## 2022-06-16 ENCOUNTER — Encounter: Payer: Self-pay | Admitting: Neurology

## 2022-06-16 ENCOUNTER — Ambulatory Visit: Payer: BC Managed Care – PPO | Admitting: Neurology

## 2022-06-16 VITALS — BP 120/82 | HR 91 | Ht 64.0 in | Wt 153.4 lb

## 2022-06-16 DIAGNOSIS — Z87898 Personal history of other specified conditions: Secondary | ICD-10-CM | POA: Diagnosis not present

## 2022-06-16 DIAGNOSIS — G43829 Menstrual migraine, not intractable, without status migrainosus: Secondary | ICD-10-CM | POA: Diagnosis not present

## 2022-06-16 DIAGNOSIS — G43109 Migraine with aura, not intractable, without status migrainosus: Secondary | ICD-10-CM | POA: Diagnosis not present

## 2022-06-16 MED ORDER — TOPIRAMATE 25 MG PO TABS: 25.0000 mg | ORAL_TABLET | Freq: Every day | ORAL | 5 refills | Status: DC

## 2022-06-16 MED ORDER — RIZATRIPTAN BENZOATE 10 MG PO TABS: 10.0000 mg | ORAL_TABLET | ORAL | 5 refills | Status: DC | PRN

## 2022-06-16 NOTE — Patient Instructions (Signed)
  Start topiramate 25mg  at bedtime.   Take rizatriptan 10mg  at earliest onset of headache.  May repeat dose once in 2 hours if needed.  Maximum 2 tablets in 24 hours. Limit use of pain relievers to no more than 2 days out of the week.  These medications include acetaminophen, NSAIDs (ibuprofen/Advil/Motrin, naproxen/Aleve, triptans (Imitrex/sumatriptan), Excedrin, and narcotics.  This will help reduce risk of rebound headaches. Be aware of common food triggers:  - Caffeine:  coffee, black tea, cola, Mt. Dew  - Chocolate  - Dairy:  aged cheeses (brie, blue, cheddar, gouda, [place], provolone, [place], Swiss, etc), chocolate milk, buttermilk, sour cream, limit eggs and yogurt  - Nuts, peanut butter  - Alcohol  - Cereals/grains:  FRESH breads (fresh bagels, sourdough, doughnuts), yeast productions  - Processed/canned/aged/cured meats (pre-packaged deli meats, hotdogs)  - MSG/glutamate:  soy sauce, flavor enhancer, pickled/preserved/marinated foods  - Sweeteners:  aspartame (Equal, Nutrasweet).  Sugar and Splenda are okay  - Vegetables:  legumes (lima beans, lentils, snow peas, fava beans, pinto peans, peas, garbanzo beans), sauerkraut, onions, olives, pickles  - Fruit:  avocados, bananas, citrus fruit (orange, lemon, grapefruit), mango  - Other:  Frozen meals, macaroni and cheese Routine exercise Stay adequately hydrated (aim for 64 oz water daily) Keep headache diary Maintain proper stress management Maintain proper sleep hygiene Do not skip meals Consider supplements:  magnesium citrate 400mg  daily, riboflavin 400mg  daily, coenzyme Q10 100mg  three times daily.

## 2022-07-25 ENCOUNTER — Telehealth: Payer: Self-pay | Admitting: Neurology

## 2022-07-25 NOTE — Telephone Encounter (Signed)
Patient states that she was taking the Topamax daily and she did not like the side effects that she was having  so she wanted to know if she should just take her rescue medication or if he wants to do something else

## 2022-07-26 NOTE — Telephone Encounter (Signed)
Pt called As long as the migraines overall remain infrequent, she may hold off on a preventative and she can just use the rescue medication when needed, she will call back if they get to where she need a preventative

## 2022-11-11 NOTE — Progress Notes (Unsigned)
NEUROLOGY FOLLOW UP OFFICE NOTE  Whitney Padilla [id number]  Assessment/Plan:   Menstrual related migraine/migraine with aura, without status migrainosus, not intractable Remote history of seizure x 2.  Workup initially negative.  No recurrent episodes since 2007.  Further workup/management not warranted at this time.   Migraine prevention:  Start Ajovy every 28 days Migraine rescue:  Ubrelvy 100mg  Limit use of pain relievers to no more than 2 days out of week to prevent risk of rebound or medication-overuse headache. Keep headache diary Follow up 4 to 5 months.       Subjective:  Whitney Padilla is a 37 year old female with history of hyperthyroidism/goiter s/p thyroidectomy and remote history of isolated seizure who follows up for migraines.  UPDATE: Discontinued topiramate due to side effects.  She also stopped rizatriptan because it made her migraines worse.  Tried a sample of Ubrelvy and migraine aborted in 30 minutes Intensity:  severe Duration:  4-5 days Frequency:  3-4 times a month (total 8-10 days a month) Current NSAIDS/analgesics:  acetaminophen, Excedrin (rare) Current triptans: none Current ergotamine:  none Current anti-emetic:  promethazine 25mg  PR Current muscle relaxants:  none Current Antihypertensive medications:  metoprolol succinate Current Antidepressant medications:  none Current Anticonvulsant medications: none Current anti-CGRP:  none Current Vitamins/Herbal/Supplements:  none Current Antihistamines/Decongestants:  none Other therapy:  none       Caffeine:  2-3 cups coffee a month.  At least one soda or tea daily Does not drink enough water Exercise:  not routine Depression:  no; Anxiety:  no Other pain:  no Sleep hygiene:  somewhat improved as no longer co-sleeping with child.  Wakes up 2-3 times a night (go to bathroom or to check on her children)  HISTORY:  Onset:  Since high school following head injury when she fell off of swing.   Improved.  Migraines worse since around 2021. Location:  band-like Quality:  throbbing Intensity:  Severe Aura:  blurred vision (unilateral then becomes bilateral) for 10-15 minutes an hour before headache Prodrome:  absent Associated symptoms:  Nausea, photophobia, phonophobia.  Rarely with vomiting.  She denies associated unilateral numbness or weakness. Duration:  Usually last 2 days.  May last 4-5 days during menses (total 8-10 headache days a month) Frequency:  usually 3-4 times a month.   Triggers:  menses, smells (perfumes, gasoline), lights (street lights, ceiling lights) Relieving factors:  vomiting Activity:  aggravates   She had one or two seizures in 2005-2007.  Unclear if related to the prior head injury.  Passed out at work and woke up in hospital.  At the time, the hospital said it wasn't a seizure.  Followed up with PCP who admitted her for seizure workup.  Workup was negative (MRI, EEG).  May have had a second seizure during this time period, lost consciousness very briefly.  Noted to have convulsions.  Associated with migraine.  No subsequent episodes.     Past NSAIDS/analgesics:  ibuprofen (GI upset) Past abortive triptans:  sumatriptan tab, rizatriptan (made migraines worse) Past abortive ergotamine:  none Past muscle relaxants:  none Past anti-emetic:  none Past antihypertensive medications:  none Past antidepressant medications:  none Past anticonvulsant medications:  topiramate (did not tolerate) Past anti-CGRP:  none Past vitamins/Herbal/Supplements:  none Past antihistamines/decongestants:  none Other past therapies:  none    Family history of headache:  No.   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  PAST MEDICAL HISTORY: Past Medical History:  Diagnosis Date   Heart palpitations    Hyperthyroidism    Indigestion    has only occasionally,reports she doesn't use anything for it    Migraine    "a  few/year; once accompanied by a seizure," (02/24/2017)   Seizures ([facility]) 2010   x1- says she never took medicine for it.    Thyroid goiter    /notes 02/16/2017    MEDICATIONS: Current Outpatient Medications on File Prior to Visit  Medication Sig Dispense Refill   acetaminophen (TYLENOL) 500 MG tablet Take 500 mg by mouth every 6 (six) hours as needed.     ALAYCEN 1/35 tablet Take 1 tablet by mouth daily.     No current facility-administered medications on file prior to visit.     ALLERGIES: No Known Allergies  FAMILY HISTORY: No family history on file.    Objective:  Blood pressure (!) 146/86, pulse 97, height 5\' 4"  (1.626 m), weight 149 lb 9.6 oz (67.9 kg), SpO2 98 %. General: No acute distress.  Patient appears well-groomed.     Metta Clines, DO  CC: Pamalee Leyden, DO

## 2022-11-14 ENCOUNTER — Encounter: Payer: Self-pay | Admitting: Neurology

## 2022-11-14 ENCOUNTER — Ambulatory Visit: Payer: BC Managed Care – PPO | Admitting: Neurology

## 2022-11-14 VITALS — BP 146/86 | HR 97 | Ht 64.0 in | Wt 149.6 lb

## 2022-11-14 DIAGNOSIS — G43109 Migraine with aura, not intractable, without status migrainosus: Secondary | ICD-10-CM

## 2022-11-14 MED ORDER — UBRELVY 100 MG PO TABS: 1.0000 | ORAL_TABLET | ORAL | 11 refills | Status: DC | PRN

## 2022-11-14 MED ORDER — AJOVY 225 MG/1.5ML ~~LOC~~ SOAJ: 225.0000 mg | SUBCUTANEOUS | 5 refills | Status: DC

## 2022-11-14 NOTE — Patient Instructions (Signed)
Start Ajovy every 28 days Ubrelvy as needed. Follow up 4 to 5 months.

## 2022-11-15 ENCOUNTER — Telehealth: Payer: Self-pay | Admitting: Pharmacy Technician

## 2022-11-15 NOTE — Telephone Encounter (Signed)
Patient Advocate Encounter   Received notification that prior authorization for Ubrelvy 100MG  tablets is required.   PA submitted on 11/15/2022 Key [id number] Status is pending       [name], [place] Patient Advocate Specialist [place] Patient Advocate Team Direct Number: [phone]  Fax: [phone]

## 2022-11-18 ENCOUNTER — Telehealth: Payer: Self-pay | Admitting: Neurology

## 2022-11-18 NOTE — Telephone Encounter (Signed)
Pt called in stating her Ajovy and Ubrelvy needed prior auth. The Roselyn Meier was denied and she was told she needed to try Nurtec instead. She requests a call back.

## 2022-11-21 MED ORDER — NURTEC 75 MG PO TBDP: 75.0000 mg | ORAL_TABLET | ORAL | 5 refills | Status: DC | PRN

## 2022-11-21 NOTE — Telephone Encounter (Signed)
She may come by the office to try samples of Nurtec (one daily as needed) and give [place] an update regarding which medication she prefers.   Per Patient, unable to come by and pick up samples. Nurtec sent to [facility] in [place].

## 2022-11-21 NOTE — Telephone Encounter (Signed)
Pt called in wanting to follow up on needing to try the Nurtec instead of the [place] for her insurance. She is currently without any medication.

## 2022-11-29 ENCOUNTER — Telehealth: Payer: Self-pay | Admitting: Neurology

## 2022-11-29 NOTE — Telephone Encounter (Signed)
Per Patient she was advised by the insurance provider needs more information sent in.

## 2022-11-29 NOTE — Telephone Encounter (Signed)
Patient states that she has been trying to work with insurance and our office on trying to get her ajovy approved by insurance. She said that she just spoke with insurance and they state that we should call them at this number [phone] to try to get it approved by speaking to someone at their office

## 2022-11-29 NOTE — Telephone Encounter (Signed)
Nurtec PA approved:

## 2022-11-30 ENCOUNTER — Other Ambulatory Visit (HOSPITAL_COMMUNITY): Payer: Self-pay

## 2022-11-30 ENCOUNTER — Telehealth: Payer: Self-pay | Admitting: Pharmacy Technician

## 2022-11-30 NOTE — Telephone Encounter (Signed)
Patient Advocate Encounter  Prior Authorization for AJOVY 225MG has been approved.    PA# [id number] Effective dates: 2.21.24 through 5.21.24    Received notification from [employer] that prior authorization for AJOVY 225MG is required.   PA submitted on 2.21.24 Key [id number] Status is pending

## 2022-12-01 NOTE — Telephone Encounter (Signed)
Patient advised of the Approval and to call insurance to have medication filled.

## 2022-12-02 NOTE — Telephone Encounter (Signed)
Patient Advocate Encounter  Received a fax from [employer] regarding Prior Authorization for Ubrelvy '100MG'$  tablets.   Authorization has been DENIED due to    Determination letter attached to patient chart

## 2023-03-10 ENCOUNTER — Other Ambulatory Visit (HOSPITAL_COMMUNITY): Payer: Self-pay

## 2023-03-10 ENCOUNTER — Telehealth: Payer: Self-pay | Admitting: Pharmacy Technician

## 2023-03-10 NOTE — Telephone Encounter (Signed)
Patient Advocate Encounter  Received notification from [employer] that prior authorization for AJOVY 225MG  is required.   PA submitted on 5.31.24 BUT CANCELLED Key [id number] Status is pending

## 2023-03-10 NOTE — Telephone Encounter (Signed)
Per patient [facility] got it. She should be good with the approval in Feb. 2024

## 2023-03-11 ENCOUNTER — Other Ambulatory Visit (HOSPITAL_COMMUNITY): Payer: Self-pay

## 2023-03-15 NOTE — Progress Notes (Signed)
NEUROLOGY FOLLOW UP OFFICE NOTE  Whitney Padilla [id number]  Assessment/Plan:   Menstrual related migraine/migraine with aura, without status migrainosus, not intractable Remote history of seizure x 2.  Workup initially negative.  No recurrent episodes since 2007.  Further workup/management not warranted at this time.   Migraine prevention:  Ajovy every 28 days Migraine rescue:  Ubrelvy 100mg .  Failed Nurtec Limit use of pain relievers to no more than 2 days out of week to prevent risk of rebound or medication-overuse headache. Keep headache diary Follow up 6 months.       Subjective:  Whitney Padilla is a 37 year old female with history of hyperthyroidism/goiter s/p thyroidectomy and remote history of isolated seizure who follows up for migraines.  UPDATE: Started Arnetha Massy.  Does have an injection site reaction for a couple of days but manageable.  Helpful.  Insurance had her try Nurtec before approving Ubrelvy.  Nurtec was ineffective. Intensity:  severe Duration:  Several hours with Nurtec.  30 minutes with Bernita Raisin Frequency: Still occurs mostly around her cycle.  Only one headache day in last 4 weeks (week of her menses).   Current NSAIDS/analgesics:  acetaminophen, Excedrin (rare) Current triptans: none Current ergotamine:  none Current anti-emetic:  promethazine 25mg  PR Current muscle relaxants:  none Current Antihypertensive medications:  metoprolol succinate Current Antidepressant medications:  none Current Anticonvulsant medications: none Current anti-CGRP:  none Current Vitamins/Herbal/Supplements:  none Current Antihistamines/Decongestants:  none Other therapy:  none       Caffeine:  2-3 cups coffee a month.  At least one soda or tea daily Does not drink enough water Exercise:  not routine Depression:  no; Anxiety:  no Other pain:  no Sleep hygiene:  somewhat improved as no longer co-sleeping with child.  Wakes up 2-3 times a night (go to bathroom or to check  on her children)  HISTORY:  Onset:  Since high school following head injury when she fell off of swing.  Improved.  Migraines worse since around 2021. Location:  band-like Quality:  throbbing Intensity:  Severe Aura:  blurred vision (unilateral then becomes bilateral) for 10-15 minutes an hour before headache Prodrome:  absent Associated symptoms:  Nausea, photophobia, phonophobia.  Rarely with vomiting.  She denies associated unilateral numbness or weakness. Duration:  Usually last 2 days.  May last 4-5 days during menses (total 8-10 headache days a month) Frequency:  usually 3-4 times a month.   Triggers:  menses, smells (perfumes, gasoline), lights (street lights, ceiling lights) Relieving factors:  vomiting Activity:  aggravates   She had one or two seizures in 2005-2007.  Unclear if related to the prior head injury.  Passed out at work and woke up in hospital.  At the time, the hospital said it wasn't a seizure.  Followed up with PCP who admitted her for seizure workup.  Workup was negative (MRI, EEG).  May have had a second seizure during this time period, lost consciousness very briefly.  Noted to have convulsions.  Associated with migraine.  No subsequent episodes.     Past NSAIDS/analgesics:  ibuprofen (GI upset) Past abortive triptans:  sumatriptan tab, rizatriptan (made migraines worse) Past abortive ergotamine:  none Past muscle relaxants:  none Past anti-emetic:  none Past antihypertensive medications:  none Past antidepressant medications:  none Past anticonvulsant medications:  topiramate (did not tolerate) Past anti-CGRP:  Nurtec PRN Past vitamins/Herbal/Supplements:  none Past antihistamines/decongestants:  none Other past therapies:  none    Family history of headache:  No.   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    PAST MEDICAL HISTORY: Past Medical History:  Diagnosis Date   Heart palpitations     Hyperthyroidism    Indigestion    has only occasionally,reports she doesn't use anything for it    Migraine    "a few/year; once accompanied by a seizure," (02/24/2017)   Seizures ([facility]) 2010   x1- says she never took medicine for it.    Thyroid goiter    /notes 02/16/2017    MEDICATIONS: Current Outpatient Medications on File Prior to Visit  Medication Sig Dispense Refill   acetaminophen (TYLENOL) 500 MG tablet Take 500 mg by mouth every 6 (six) hours as needed.     ALAYCEN 1/35 tablet Take 1 tablet by mouth daily.     No current facility-administered medications on file prior to visit.     ALLERGIES: No Known Allergies  FAMILY HISTORY: No family history on file.    Objective:  Blood pressure 120/79, pulse 92, height 5\' 4"  (1.626 m), weight 157 lb (71.2 kg), SpO2 93 %.  General: No acute distress.  Patient appears well-groomed.       Shon Millet, DO  CC: Vangie Bicker, DO

## 2023-03-17 ENCOUNTER — Encounter: Payer: Self-pay | Admitting: Neurology

## 2023-03-17 ENCOUNTER — Ambulatory Visit: Payer: BC Managed Care – PPO | Admitting: Neurology

## 2023-03-17 VITALS — BP 120/79 | HR 92 | Ht 64.0 in | Wt 157.0 lb

## 2023-03-17 DIAGNOSIS — G43829 Menstrual migraine, not intractable, without status migrainosus: Secondary | ICD-10-CM

## 2023-03-17 MED ORDER — UBRELVY 100 MG PO TABS: 1.0000 | ORAL_TABLET | ORAL | 11 refills | Status: AC | PRN

## 2023-03-17 MED ORDER — AJOVY 225 MG/1.5ML ~~LOC~~ SOAJ: 225.0000 mg | SUBCUTANEOUS | 5 refills | Status: DC

## 2023-03-17 NOTE — Patient Instructions (Signed)
Continue Ajovy every 28 days Ubrelvy as needed. Limit use of pain relievers to no more than 2 days out of week to prevent risk of rebound or medication-overuse headache. Keep headache diary

## 2023-03-20 ENCOUNTER — Other Ambulatory Visit (HOSPITAL_COMMUNITY): Payer: Self-pay

## 2023-03-28 ENCOUNTER — Telehealth: Payer: Self-pay

## 2023-03-28 NOTE — Telephone Encounter (Signed)
Pa needed for ubrelvy 100 mg

## 2023-03-29 ENCOUNTER — Other Ambulatory Visit (HOSPITAL_COMMUNITY): Payer: Self-pay

## 2023-03-29 ENCOUNTER — Telehealth: Payer: Self-pay

## 2023-03-29 NOTE — Telephone Encounter (Signed)
Per patient, her insurance is active and will call the insurance company and she will call us back.

## 2023-03-29 NOTE — Telephone Encounter (Signed)
A new telephone call encounter has been made for this PA Request-please see telephone note dated ...03/29/2023 

## 2023-03-29 NOTE — Telephone Encounter (Signed)
   Does PT have updated insurance? Per [facility] coverage has been terminated?

## 2023-03-30 NOTE — Telephone Encounter (Signed)
Patient called back, Per Patient the insurance states if you continue to have problems please call to do PA

## 2023-03-31 ENCOUNTER — Telehealth: Payer: Self-pay | Admitting: Neurology

## 2023-03-31 ENCOUNTER — Encounter: Payer: Self-pay | Admitting: Neurology

## 2023-03-31 NOTE — Telephone Encounter (Signed)
[facility] wihc medication needs a refill. Refills sent 03/14/23.   But the PA is still being worked on for [employer] Samples can be given if needed. We close at 4:30 pm

## 2023-03-31 NOTE — Telephone Encounter (Signed)
Patient is reaching out for mediation refill .Whitney Padilla

## 2023-04-06 ENCOUNTER — Other Ambulatory Visit (HOSPITAL_COMMUNITY): Payer: Self-pay

## 2023-04-11 ENCOUNTER — Telehealth: Payer: Self-pay | Admitting: Pharmacy Technician

## 2023-04-11 ENCOUNTER — Other Ambulatory Visit (HOSPITAL_COMMUNITY): Payer: Self-pay

## 2023-04-11 NOTE — Telephone Encounter (Signed)
Patient Advocate Encounter  Prior Authorization for AJOVY 225MG  has been approved with [employer].    PA# [id number] Effective dates: 7.2.24 through 7.2.25  Per [facility] test claim, copay for 28 days supply is $24.98   Received notification from [employer]THEM that prior authorization for AJOVY 225MG  is required.   PA submitted on 7.2.24 Key [id number] Status is pending

## 2023-04-11 NOTE — Telephone Encounter (Signed)
Patient Advocate Encounter  Received notification from [facility] that prior authorization for UBRELVY 100MG  is required.   PA submitted on 7.2.24 Key [id number] Status is pending

## 2023-04-19 NOTE — Telephone Encounter (Signed)
Approval letter received from [facility].  Bernita Raisin 04/11/23-04/10/24.

## 2023-04-29 ENCOUNTER — Other Ambulatory Visit (HOSPITAL_COMMUNITY): Payer: Self-pay

## 2023-06-10 ENCOUNTER — Other Ambulatory Visit: Payer: Self-pay | Admitting: Neurology

## 2023-06-19 ENCOUNTER — Other Ambulatory Visit (HOSPITAL_COMMUNITY): Payer: Self-pay

## 2023-06-21 ENCOUNTER — Other Ambulatory Visit (HOSPITAL_COMMUNITY): Payer: Self-pay

## 2023-06-21 NOTE — Telephone Encounter (Signed)
PA was approved in previous encounter. Nothing else is needed at this time.

## 2023-06-28 ENCOUNTER — Other Ambulatory Visit (HOSPITAL_COMMUNITY): Payer: Self-pay

## 2023-06-28 NOTE — Telephone Encounter (Signed)
This was last filled at patient pharmacy on 9.3.24. next earliest fill would be on 9.27.24.

## 2023-09-19 ENCOUNTER — Ambulatory Visit: Payer: BC Managed Care – PPO | Admitting: Neurology

## 2023-10-02 NOTE — Progress Notes (Unsigned)
NEUROLOGY FOLLOW UP OFFICE NOTE  Whitney Padilla [id number]  Assessment/Plan:   Menstrual related migraine/migraine with aura, without status migrainosus, not intractable Remote history of seizure x 2.  Workup initially negative.  No recurrent episodes since 2007.  Further workup/management not warranted at this time.   Migraine prevention:  Ajovy every 28 days.  Monitor for improvement since discontinuing birth control Migraine rescue:  Take Zofran ODT 4mg  and Ubrelvy 100mg  at earliest onset of migraine.  If no improvement in 30 minutes, use sumatriptan 20mg  NS as second line Limit use of pain relievers to no more than 2 days out of week to prevent risk of rebound or medication-overuse headache. Keep headache diary Follow up 5 months.       Subjective:  Whitney Padilla is a 37 year old female with history of hyperthyroidism/goiter s/p thyroidectomy and remote history of isolated seizure who follows up for migraines.  UPDATE: Migraines now only occur around her cycle.  However, Bernita Raisin has not helped for the past 2 cycles.  She stopped her birth control over a month ago.   Intensity:  severe Duration:  Several hours with Bernita Raisin Frequency: Still occurs mostly around her cycle.  At least 2 days week of menses (one 2 days prior to start of menses and then another at the end) Current NSAIDS/analgesics:  acetaminophen, Excedrin (rare) Current triptans: none Current ergotamine:  none Current anti-emetic:  promethazine 25mg  PR Current muscle relaxants:  none Current Antihypertensive medications:  metoprolol succinate Current Antidepressant medications:  none Current Anticonvulsant medications: none Current anti-CGRP:  Ashby Dawes 100mg  Current Vitamins/Herbal/Supplements:  none Current Antihistamines/Decongestants:  none Other therapy:  none       Caffeine:  2-3 cups coffee a month.  At least one soda or tea daily Does not drink enough water Exercise:  not  routine Depression:  no; Anxiety:  no Other pain:  no Sleep hygiene:  somewhat improved as no longer co-sleeping with child.  Wakes up 2-3 times a night (go to bathroom or to check on her children)  HISTORY:  Onset:  Since high school following head injury when she fell off of swing.  Improved.  Migraines worse since around 2021. Location:  band-like Quality:  throbbing Intensity:  Severe Aura:  blurred vision (unilateral then becomes bilateral) for 10-15 minutes an hour before headache Prodrome:  absent Associated symptoms:  Nausea, photophobia, phonophobia.  Rarely with vomiting.  She denies associated unilateral numbness or weakness. Duration:  Usually last 2 days.  May last 4-5 days during menses (total 8-10 headache days a month) Frequency:  usually 3-4 times a month.   Triggers:  menses, smells (perfumes, gasoline), lights (street lights, ceiling lights) Relieving factors:  vomiting Activity:  aggravates   She had one or two seizures in 2005-2007.  Unclear if related to the prior head injury.  Passed out at work and woke up in hospital.  At the time, the hospital said it wasn't a seizure.  Followed up with PCP who admitted her for seizure workup.  Workup was negative (MRI, EEG).  May have had a second seizure during this time period, lost consciousness very briefly.  Noted to have convulsions.  Associated with migraine.  No subsequent episodes.     Past NSAIDS/analgesics:  ibuprofen (GI upset) Past abortive triptans:  sumatriptan tab, rizatriptan (made migraines worse) Past abortive ergotamine:  none Past muscle relaxants:  none Past anti-emetic:  Zofran, promethazine PR Past antihypertensive medications:  none Past antidepressant medications:  none  Past anticonvulsant medications:  topiramate (did not tolerate) Past anti-CGRP:  Nurtec PRN Past vitamins/Herbal/Supplements:  none Past antihistamines/decongestants:  none Other past therapies:  none    Family history of  headache:  No.   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    PAST MEDICAL HISTORY: Past Medical History:  Diagnosis Date   Heart palpitations    Hyperthyroidism    Indigestion    has only occasionally,reports she doesn't use anything for it    Migraine    "a few/year; once accompanied by a seizure," (02/24/2017)   Seizures ([facility]) 2010   x1- says she never took medicine for it.    Thyroid goiter    /notes 02/16/2017    MEDICATIONS: Current Outpatient Medications on File Prior to Visit  Medication Sig Dispense Refill   acetaminophen (TYLENOL) 500 MG tablet Take 500 mg by mouth every 6 (six) hours as needed.     ALAYCEN 1/35 tablet Take 1 tablet by mouth daily.     Fremanezumab-vfrm (AJOVY) 225 MG/1.5ML SOAJ Inject 225 mg into the skin every 28 (twenty-eight) days. 1.68 mL 5   levothyroxine (SYNTHROID) 112 MCG tablet Take 112 mcg by mouth daily.     Ubrogepant (UBRELVY) 100 MG TABS Take 1 tablet (100 mg total) by mouth as needed. May repeat after 2 hours.  Maximum 2 tablets in 24 hours. 16 tablet 11   No current facility-administered medications on file prior to visit.    ALLERGIES: No Known Allergies  FAMILY HISTORY: No family history on file.    Objective:  Blood pressure 115/73, pulse 85, height 5\' 4"  (1.626 m), weight 154 lb (69.9 kg), SpO2 99%. General: No acute distress.  Patient appears well-groomed.   Head:  Normocephalic/atraumatic Eyes:  Fundi examined but not visualized Neck: supple, no paraspinal tenderness, full range of motion Heart:  Regular rate and rhythm Lungs:  Clear to auscultation bilaterally Back: No paraspinal tenderness Neurological Exam: alert and oriented.  Speech fluent and not dysarthric, language intact.  CN II-XII intact. Bulk and tone normal, muscle strength 5/5 throughout.  Sensation to light touch intact.  Deep tendon reflexes 2+ throughout, toes downgoing.  Finger to nose testing intact.  Gait  normal, Romberg negative.   Shon Millet, DO  CC: Vangie Bicker, DO

## 2023-10-03 ENCOUNTER — Encounter: Payer: Self-pay | Admitting: Neurology

## 2023-10-03 ENCOUNTER — Ambulatory Visit: Payer: BC Managed Care – PPO | Admitting: Neurology

## 2023-10-03 VITALS — BP 115/73 | HR 85 | Ht 64.0 in | Wt 154.0 lb

## 2023-10-03 DIAGNOSIS — G43829 Menstrual migraine, not intractable, without status migrainosus: Secondary | ICD-10-CM | POA: Diagnosis not present

## 2023-10-03 MED ORDER — ONDANSETRON 4 MG PO TBDP: 4.0000 mg | ORAL_TABLET | Freq: Three times a day (TID) | ORAL | 5 refills | Status: AC | PRN

## 2023-10-03 MED ORDER — SUMATRIPTAN 20 MG/ACT NA SOLN: 20.0000 mg | NASAL | 5 refills | Status: AC | PRN

## 2023-10-03 NOTE — Patient Instructions (Signed)
Continue Ajovy Take ondansetron (Zofran) and Ubrelvy at earliest onset of migraine.  If no improvement in 30 minutes, use the sumatriptan nasal spray - 1 spray, may repeat after 2 hours if needed.  Maximum 2 sprays in 24 hours.  Blow your nose prior to use.  Place finger over 1 nostril and spray into the other nostril.  Breath normally/gently.  Let me know if this works or not. Follow up 5 months

## 2023-10-15 ENCOUNTER — Other Ambulatory Visit: Payer: Self-pay | Admitting: Neurology

## 2023-10-17 ENCOUNTER — Other Ambulatory Visit: Payer: Self-pay | Admitting: Physician Assistant

## 2023-10-18 ENCOUNTER — Other Ambulatory Visit (HOSPITAL_COMMUNITY): Payer: Self-pay

## 2023-10-18 NOTE — Telephone Encounter (Signed)
 PA is already and approved through 04/2024. Patient pharmacy must put in [facility] (Submission Clarification Code) 10 in order for it to be covered. Additionally patient must transition to mail order in order to continue to receive these benefits. Once that code is entered, test billing with [facility] returns a copay of $24.98. Will follow up with pt pharmacy on 1.9.25.

## 2024-01-08 ENCOUNTER — Telehealth: Payer: Self-pay | Admitting: Neurology

## 2024-01-08 NOTE — Telephone Encounter (Signed)
 Ajovy,Ubrelvy,Sumatriptan what the patient is taking. She is due for her ajovy injection in couple of days.   Advised patient to wait until she hear back from [facility].

## 2024-01-08 NOTE — Telephone Encounter (Signed)
 Caller stated she just found out that she is pregnant and she would like to speak with nurse about medication just to be sure it does not harm fetus.

## 2024-01-09 NOTE — Telephone Encounter (Signed)
 Patient advise per Dr.jaffe, I would, at least just to see how she is doing

## 2024-01-09 NOTE — Telephone Encounter (Signed)
 Patient advised of Dr.jaffe note, She will need to stop all medications at this time and just use Tylenol if needed.  Migraines are typically improved during the pregnancy.    Patient wanted to know if she should keep her Appt in June.

## 2024-03-15 ENCOUNTER — Other Ambulatory Visit (HOSPITAL_COMMUNITY): Payer: Self-pay

## 2024-03-19 ENCOUNTER — Ambulatory Visit: Payer: BC Managed Care – PPO | Admitting: Neurology

## 2024-04-03 ENCOUNTER — Other Ambulatory Visit (HOSPITAL_COMMUNITY): Payer: Self-pay

## 2024-04-03 ENCOUNTER — Telehealth: Payer: Self-pay

## 2024-04-03 NOTE — Telephone Encounter (Signed)
 Pharmacy Patient Advocate Encounter   Received notification from CoverMyMeds that prior authorization for Ubrelvy  100MG  tablets is required/requested.   Insurance verification completed.   The patient is insured through [employer] .   Per test claim: The current 30** day co-pay is, $0.00.  No PA needed at this time. This test claim was processed through [facility]- copay amounts may vary at other pharmacies due to pharmacy/plan contracts, or as the patient moves through the different stages of their insurance plan.     **Quantity of 16 tablets per 30 days.
